# Patient Record
Sex: Female | Born: 1990 | Hispanic: Yes | Marital: Single | Smoking: Never smoker
Health system: Southern US, Community
[De-identification: ages and names within clinical notes are randomized; demographics above are authoritative.]

## PROBLEM LIST (undated history)

## (undated) ENCOUNTER — Inpatient Hospital Stay (HOSPITAL_COMMUNITY): Payer: Self-pay

## (undated) DIAGNOSIS — K8020 Calculus of gallbladder without cholecystitis without obstruction: Secondary | ICD-10-CM

## (undated) DIAGNOSIS — Z8489 Family history of other specified conditions: Secondary | ICD-10-CM

## (undated) DIAGNOSIS — D649 Anemia, unspecified: Secondary | ICD-10-CM

## (undated) DIAGNOSIS — I82409 Acute embolism and thrombosis of unspecified deep veins of unspecified lower extremity: Secondary | ICD-10-CM

## (undated) HISTORY — PX: DILATION AND CURETTAGE OF UTERUS: SHX78

---
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
## 2008-01-14 ENCOUNTER — Encounter: Payer: Self-pay | Admitting: Family Medicine

## 2008-01-14 ENCOUNTER — Ambulatory Visit: Payer: Self-pay | Admitting: Physician Assistant

## 2008-01-14 ENCOUNTER — Inpatient Hospital Stay (HOSPITAL_COMMUNITY): Admission: AD | Admit: 2008-01-14 | Discharge: 2008-01-16 | Payer: Self-pay | Admitting: Obstetrics & Gynecology

## 2008-01-21 ENCOUNTER — Ambulatory Visit: Payer: Self-pay | Admitting: Family Medicine

## 2008-01-21 DIAGNOSIS — I82890 Acute embolism and thrombosis of other specified veins: Secondary | ICD-10-CM

## 2008-01-21 LAB — CONVERTED CEMR LAB: INR: 1.4

## 2008-01-25 ENCOUNTER — Ambulatory Visit: Payer: Self-pay | Admitting: Family Medicine

## 2008-01-25 LAB — CONVERTED CEMR LAB: INR: 1.9

## 2008-02-04 ENCOUNTER — Ambulatory Visit: Payer: Self-pay | Admitting: Family Medicine

## 2008-02-11 ENCOUNTER — Ambulatory Visit: Payer: Self-pay | Admitting: Family Medicine

## 2008-02-11 LAB — CONVERTED CEMR LAB: INR: 1.9

## 2009-04-12 ENCOUNTER — Inpatient Hospital Stay (HOSPITAL_COMMUNITY): Admission: AD | Admit: 2009-04-12 | Discharge: 2009-04-12 | Payer: Self-pay | Admitting: Obstetrics and Gynecology

## 2009-04-14 ENCOUNTER — Inpatient Hospital Stay (HOSPITAL_COMMUNITY): Admission: AD | Admit: 2009-04-14 | Discharge: 2009-04-14 | Payer: Self-pay | Admitting: Obstetrics and Gynecology

## 2009-04-27 ENCOUNTER — Ambulatory Visit (HOSPITAL_COMMUNITY): Admission: RE | Admit: 2009-04-27 | Discharge: 2009-04-27 | Payer: Self-pay | Admitting: Obstetrics and Gynecology

## 2009-04-27 ENCOUNTER — Ambulatory Visit: Payer: Self-pay | Admitting: Obstetrics and Gynecology

## 2009-09-10 ENCOUNTER — Emergency Department (HOSPITAL_COMMUNITY): Admission: EM | Admit: 2009-09-10 | Discharge: 2009-09-10 | Payer: Self-pay | Admitting: Emergency Medicine

## 2009-09-16 ENCOUNTER — Inpatient Hospital Stay (HOSPITAL_COMMUNITY): Admission: AD | Admit: 2009-09-16 | Discharge: 2009-09-16 | Payer: Self-pay | Admitting: Obstetrics & Gynecology

## 2009-10-25 ENCOUNTER — Inpatient Hospital Stay (HOSPITAL_COMMUNITY): Admission: AD | Admit: 2009-10-25 | Discharge: 2009-10-25 | Payer: Self-pay | Admitting: Obstetrics & Gynecology

## 2009-10-25 ENCOUNTER — Ambulatory Visit: Payer: Self-pay | Admitting: Nurse Practitioner

## 2009-10-28 ENCOUNTER — Ambulatory Visit: Payer: Self-pay | Admitting: Obstetrics and Gynecology

## 2009-10-28 ENCOUNTER — Ambulatory Visit (HOSPITAL_COMMUNITY): Admission: RE | Admit: 2009-10-28 | Discharge: 2009-10-28 | Payer: Self-pay | Admitting: Obstetrics and Gynecology

## 2009-11-07 ENCOUNTER — Ambulatory Visit (HOSPITAL_COMMUNITY): Admission: RE | Admit: 2009-11-07 | Discharge: 2009-11-07 | Payer: Self-pay | Admitting: Obstetrics and Gynecology

## 2009-11-07 ENCOUNTER — Ambulatory Visit: Payer: Self-pay | Admitting: Nurse Practitioner

## 2010-03-16 ENCOUNTER — Ambulatory Visit: Payer: Self-pay | Admitting: Advanced Practice Midwife

## 2010-03-16 ENCOUNTER — Inpatient Hospital Stay (HOSPITAL_COMMUNITY): Admission: AD | Admit: 2010-03-16 | Discharge: 2010-03-16 | Payer: Self-pay | Admitting: Obstetrics & Gynecology

## 2010-04-15 ENCOUNTER — Inpatient Hospital Stay (HOSPITAL_COMMUNITY)
Admission: AD | Admit: 2010-04-15 | Discharge: 2010-04-16 | Payer: Self-pay | Source: Home / Self Care | Admitting: Obstetrics

## 2010-06-07 ENCOUNTER — Inpatient Hospital Stay (HOSPITAL_COMMUNITY)
Admission: AD | Admit: 2010-06-07 | Discharge: 2010-06-07 | Payer: Self-pay | Source: Home / Self Care | Attending: Obstetrics | Admitting: Obstetrics

## 2010-06-09 ENCOUNTER — Encounter: Payer: Self-pay | Admitting: Obstetrics and Gynecology

## 2010-06-13 ENCOUNTER — Inpatient Hospital Stay (HOSPITAL_COMMUNITY)
Admission: AD | Admit: 2010-06-13 | Discharge: 2010-06-13 | Payer: Self-pay | Source: Home / Self Care | Attending: Obstetrics | Admitting: Obstetrics

## 2010-06-15 ENCOUNTER — Inpatient Hospital Stay (HOSPITAL_COMMUNITY)
Admission: AD | Admit: 2010-06-15 | Discharge: 2010-06-15 | Payer: Self-pay | Source: Home / Self Care | Attending: Obstetrics | Admitting: Obstetrics

## 2010-06-15 LAB — CBC
MCH: 21.2 pg — ABNORMAL LOW (ref 26.0–34.0)
RBC: 4.2 MIL/uL (ref 3.87–5.11)

## 2010-06-17 ENCOUNTER — Inpatient Hospital Stay (HOSPITAL_COMMUNITY)
Admission: AD | Admit: 2010-06-17 | Discharge: 2010-06-19 | DRG: 775 | Disposition: A | Discharge status: Home / Self Care | Payer: Medicaid Other | Attending: Obstetrics | Admitting: Obstetrics

## 2010-06-17 LAB — CBC
Hemoglobin: 8.7 g/dL — ABNORMAL LOW (ref 12.0–15.0)
MCV: 69.2 fL — ABNORMAL LOW (ref 78.0–100.0)
Platelets: 194 10*3/uL (ref 150–400)
RDW: 17.8 % — ABNORMAL HIGH (ref 11.5–15.5)

## 2010-06-17 LAB — RAPID HIV SCREEN (WH-MAU): Rapid HIV Screen: NONREACTIVE

## 2010-06-18 LAB — CBC
Hemoglobin: 7 g/dL — ABNORMAL LOW (ref 12.0–15.0)
MCH: 20.8 pg — ABNORMAL LOW (ref 26.0–34.0)
MCHC: 30 g/dL (ref 30.0–36.0)
Platelets: 149 10*3/uL — ABNORMAL LOW (ref 150–400)
RBC: 3.32 MIL/uL — ABNORMAL LOW (ref 3.87–5.11)
RDW: 17.9 % — ABNORMAL HIGH (ref 11.5–15.5)
WBC: 8 10*3/uL (ref 4.0–10.5)

## 2010-07-30 LAB — URINALYSIS, ROUTINE W REFLEX MICROSCOPIC
Bilirubin Urine: NEGATIVE
Glucose, UA: NEGATIVE mg/dL
Nitrite: POSITIVE — AB
Protein, ur: NEGATIVE mg/dL
Specific Gravity, Urine: >1.03 — ABNORMAL HIGH (ref 1.005–1.030)
Urobilinogen, UA: 1 mg/dL (ref 0.0–1.0)
pH: 6 (ref 5.0–8.0)

## 2010-07-30 LAB — URINE MICROSCOPIC-ADD ON

## 2010-07-31 LAB — URINE CULTURE
Colony Count: >=100000
Culture  Setup Time: 201110300231

## 2010-07-31 LAB — URINALYSIS, ROUTINE W REFLEX MICROSCOPIC
Glucose, UA: NEGATIVE mg/dL
Ketones, ur: NEGATIVE mg/dL
Leukocytes, UA: NEGATIVE
Protein, ur: NEGATIVE mg/dL
Urobilinogen, UA: 0.2 mg/dL (ref 0.0–1.0)
pH: 6 (ref 5.0–8.0)

## 2010-08-05 LAB — WET PREP, GENITAL
Trich, Wet Prep: NONE SEEN
Yeast Wet Prep HPF POC: NONE SEEN

## 2010-08-05 LAB — URINALYSIS, ROUTINE W REFLEX MICROSCOPIC
Bilirubin Urine: NEGATIVE
Leukocytes, UA: NEGATIVE
Nitrite: NEGATIVE
Specific Gravity, Urine: 1.025 (ref 1.005–1.030)
Urobilinogen, UA: 0.2 mg/dL (ref 0.0–1.0)

## 2010-08-05 LAB — CBC
MCHC: 33.8 g/dL (ref 30.0–36.0)
RBC: 4.06 MIL/uL (ref 3.87–5.11)
RDW: 17.3 % — ABNORMAL HIGH (ref 11.5–15.5)

## 2010-08-05 LAB — HCG, QUANTITATIVE, PREGNANCY: hCG, Beta Chain, Quant, S: 1301 m[IU]/mL — ABNORMAL HIGH (ref <5)

## 2010-08-05 LAB — URINE MICROSCOPIC-ADD ON

## 2010-08-05 LAB — GC/CHLAMYDIA PROBE AMP, GENITAL
Chlamydia, DNA Probe: NEGATIVE
GC Probe Amp, Genital: NEGATIVE

## 2010-08-05 LAB — POCT PREGNANCY, URINE: Preg Test, Ur: POSITIVE

## 2010-08-05 LAB — ABO/RH: ABO/RH(D): A POS

## 2010-08-06 LAB — URINALYSIS, ROUTINE W REFLEX MICROSCOPIC
Nitrite: POSITIVE — AB
Protein, ur: 100 mg/dL — AB
Specific Gravity, Urine: 1.02 (ref 1.005–1.030)
Urobilinogen, UA: 1 mg/dL (ref 0.0–1.0)

## 2010-08-06 LAB — URINE MICROSCOPIC-ADD ON

## 2010-08-06 LAB — URINE CULTURE

## 2010-08-06 LAB — GC/CHLAMYDIA PROBE AMP, GENITAL
Chlamydia, DNA Probe: NEGATIVE
GC Probe Amp, Genital: NEGATIVE

## 2010-08-06 LAB — WET PREP, GENITAL: Yeast Wet Prep HPF POC: NONE SEEN

## 2010-08-06 LAB — POCT PREGNANCY, URINE: Preg Test, Ur: NEGATIVE

## 2010-08-21 LAB — URINALYSIS, ROUTINE W REFLEX MICROSCOPIC
Glucose, UA: NEGATIVE mg/dL
Ketones, ur: 15 mg/dL — AB
Leukocytes, UA: NEGATIVE
Protein, ur: NEGATIVE mg/dL
Urobilinogen, UA: 0.2 mg/dL (ref 0.0–1.0)

## 2010-08-21 LAB — CBC
Hemoglobin: 11.2 g/dL — ABNORMAL LOW (ref 12.0–15.0)
RBC: 4.27 MIL/uL (ref 3.87–5.11)
WBC: 9.5 10*3/uL (ref 4.0–10.5)

## 2010-08-21 LAB — HCG, QUANTITATIVE, PREGNANCY: hCG, Beta Chain, Quant, S: 1807 m[IU]/mL — ABNORMAL HIGH (ref <5)

## 2010-08-21 LAB — ABO/RH: ABO/RH(D): A POS

## 2010-08-21 LAB — WET PREP, GENITAL
Trich, Wet Prep: NONE SEEN
Yeast Wet Prep HPF POC: NONE SEEN

## 2010-08-21 LAB — URINE MICROSCOPIC-ADD ON

## 2010-08-21 LAB — GC/CHLAMYDIA PROBE AMP, GENITAL: Chlamydia, DNA Probe: NEGATIVE

## 2010-09-13 ENCOUNTER — Encounter: Payer: Self-pay | Admitting: Obstetrics and Gynecology

## 2010-10-01 NOTE — Discharge Summary (Signed)
Lindsey Paul, SCHUUR              ACCOUNT NO.:  [id number]   MEDICAL RECORD NO.:  [id number]          PATIENT TYPE:  INP   LOCATION:  9127                          FACILITY:  [facility]   PHYSICIAN:  Allie Bossier, MD        DATE OF BIRTH:  [birth year]   DATE OF ADMISSION:  01/14/2008  DATE OF DISCHARGE:  01/16/2008                               DISCHARGE SUMMARY   ADMITTING DIAGNOSIS:  Intrauterine pregnancy at term.   DISCHARGE DIAGNOSES:  Intrauterine pregnancy delivered at term.   HOSPITAL COURSE:  [name] is an 20 year old G3 now P-3-0-0-3 at 40  and [redacted] weeks gestational age who delivered a viable female by a normal  spontaneous vaginal delivery.  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  Upon delivery, she was started on Coumadin 5  mg per day with no bridge with Lovenox as she refused Lovenox injection.  On day of discharge, her INR was 1.0 and she was instructed to followup  at the [facility] on Tuesday.  She is to  follow with an anticoagulation regimen for 6 weeks postpartum.   DISCHARGE MEDICATIONS:  1. Ibuprofen 600 mg every 6 hours as needed for pain.  2. Coumadin 5 mg to take 1 p.o. daily until reevaluation of the      Coumadin Clinic.   DISCHARGE INSTRUCTIONS:  Normal and postpartum precautions.  Followup at  the [facility] Clinic on Tuesday, January 19, 2008.  Followup for normal  and postpartum check in 4-6 weeks.      Helane Rima, MD  Electronically Signed     ______________________________  Allie Bossier, MD    EW/MEDQ  D:  01/17/2008  T:  01/17/2008  Job:  [id number]

## 2011-02-06 LAB — GC/CHLAMYDIA PROBE AMP, GENITAL: GC Probe Amp, Genital: NEGATIVE

## 2011-02-06 LAB — URINE MICROSCOPIC-ADD ON

## 2011-02-06 LAB — URINALYSIS, ROUTINE W REFLEX MICROSCOPIC
Ketones, ur: NEGATIVE
Protein, ur: NEGATIVE
Urobilinogen, UA: 0.2

## 2011-02-06 LAB — CBC
HCT: 36.3
Hemoglobin: 12.3
MCHC: 34
MCV: 81.1
RDW: 16.3 — ABNORMAL HIGH

## 2011-02-06 LAB — WET PREP, GENITAL
Trich, Wet Prep: NONE SEEN
Yeast Wet Prep HPF POC: NONE SEEN

## 2011-02-13 LAB — URINALYSIS, ROUTINE W REFLEX MICROSCOPIC
Bilirubin Urine: NEGATIVE
Glucose, UA: NEGATIVE
Ketones, ur: NEGATIVE
Protein, ur: NEGATIVE
Urobilinogen, UA: 0.2

## 2011-02-13 LAB — URINE MICROSCOPIC-ADD ON

## 2011-02-13 LAB — URINE CULTURE: Colony Count: >=100000

## 2011-02-14 LAB — URINALYSIS, ROUTINE W REFLEX MICROSCOPIC
Glucose, UA: NEGATIVE
Hgb urine dipstick: NEGATIVE
Ketones, ur: 15 — AB
Protein, ur: NEGATIVE
pH: 6

## 2011-09-20 ENCOUNTER — Encounter (HOSPITAL_COMMUNITY): Payer: Self-pay | Admitting: Emergency Medicine

## 2011-09-20 ENCOUNTER — Emergency Department (HOSPITAL_COMMUNITY)
Admission: EM | Admit: 2011-09-20 | Discharge: 2011-09-20 | Disposition: A | Discharge status: Home / Self Care | Payer: Self-pay | Attending: Emergency Medicine | Admitting: Emergency Medicine

## 2011-09-20 DIAGNOSIS — L0291 Cutaneous abscess, unspecified: Secondary | ICD-10-CM | POA: Insufficient documentation

## 2011-09-20 DIAGNOSIS — L0390 Cellulitis, unspecified: Secondary | ICD-10-CM

## 2011-09-20 MED ORDER — CEPHALEXIN 500 MG PO CAPS: 500.0000 mg | ORAL_CAPSULE | Freq: Four times a day (QID) | ORAL | Status: AC

## 2011-09-20 MED ORDER — SULFAMETHOXAZOLE-TRIMETHOPRIM 800-160 MG PO TABS: 1.0000 | ORAL_TABLET | Freq: Two times a day (BID) | ORAL | Status: AC

## 2011-09-20 MED ORDER — IBUPROFEN 800 MG PO TABS
800.0000 mg | ORAL_TABLET | Freq: Once | ORAL | Status: AC
  Administered 2011-09-20: 800 mg via ORAL
  Filled 2011-09-20: qty 1

## 2011-09-20 NOTE — ED Provider Notes (Signed)
History   This chart was scribed for Nat Christen, MD by Melba Coon. The patient was seen in room STRE1/STRE1 and the patient's care was started at 3:01PM.    CSN: [id number]  Arrival date & time 09/20/11  1402   First MD Initiated Contact with Patient 09/20/11 1456      Chief Complaint  Patient presents with  . Abscess    (Consider location/radiation/quality/duration/timing/severity/associated sxs/prior treatment) HPI Lindsey Paul is a 21 y.o. female who presents to the Emergency Department complaining of persistent, moderate to severe tender abd RLQ abscess with an onset about a week ago. Pt has never had these types of symptoms before. Pt states that it "started it as pimple but now it is the size of a grape." No HA, fever, neck pain, sore throat, rash, back pain, CP, SOB, abd pain, n/v/d, dysuria, or extremity pain, edema, weakness, numbness, or tingling. No known allergies. No other pertinent medical symptoms.  History reviewed. No pertinent past medical history.  No past surgical history on file.  No family history on file.  History  Substance Use Topics  . Smoking status: Not on file  . Smokeless tobacco: Not on file  . Alcohol Use: Not on file    OB History    Grav Para Term Preterm Abortions TAB SAB Ect Mult Living                  Review of Systems 10 Systems reviewed and all are negative for acute change except as noted in the HPI.   Allergies  Review of patient's allergies indicates no known allergies.  Home Medications   Current Outpatient Rx  Name Route Sig Dispense Refill  . CEPHALEXIN 500 MG PO CAPS Oral Take 1 capsule (500 mg total) by mouth 4 (four) times daily. 40 capsule 0  . SULFAMETHOXAZOLE-TRIMETHOPRIM 800-160 MG PO TABS Oral Take 1 tablet by mouth 2 (two) times daily. 20 tablet 0    BP 120/68  Pulse 67  Temp(Src) 98.2 F (36.8 C) (Oral)  Resp 20  SpO2 99%  LMP 08/18/2011  Physical Exam  Nursing note and vitals  reviewed. Constitutional: She is oriented to person, place, and time. She appears well-developed and well-nourished. No distress.  HENT:  Head: Normocephalic and atraumatic.  Eyes: EOM are normal. Pupils are equal, round, and reactive to light.  Neck: Normal range of motion. Neck supple. No tracheal deviation present.  Cardiovascular: Normal rate.   No murmur heard. Pulmonary/Chest: Effort normal. No respiratory distress.  Abdominal: Soft. There is no tenderness.  Musculoskeletal: Normal range of motion. She exhibits no edema and no tenderness.  Neurological: She is alert and oriented to person, place, and time.  Skin: Skin is warm and dry.       1 cm pimple-like area within a 6 cm area of erythema with tenderness but no fluctuance; no induration  Psychiatric: She has a normal mood and affect. Her behavior is normal.    ED Course  Procedures (including critical care time)  DIAGNOSTIC STUDIES: Oxygen Saturation is 99% on rooma ir, normal by my interpretation.    COORDINATION OF CARE:  3:05PM - EDMD will prescribe abx for the pt. Pt ready for d/c   Labs Reviewed - No data to display No results found.   1. Cellulitis       MDM  Patient with mild area of cellulitis with no focal abscess to drain at this time.  Patient was given instructions to return for  worsening redness or pain.  She'll be placed on Bactrim and Keflex to cover for both MRSA and common strep bacteria that might cause this.     I personally performed the services described in this documentation, which was scribed in my presence. The recorded information has been reviewed and considered.     Nat Christen, MD 09/20/11 1539

## 2011-09-20 NOTE — ED Notes (Signed)
To ED for eval of possible abscess on rlq. Pt states this has been here for about a week. No discharge. Started as a pimple and now about large grape size.

## 2011-09-20 NOTE — Discharge Instructions (Signed)

## 2012-01-04 IMAGING — US US OB TRANSVAGINAL
1 series · 14 of 26 positions shown · non-contrast
Comparison: none

OBSTETRICAL ULTRASOUND:
 This ultrasound exam was performed in the [HOSPITAL] Ultrasound Department.  The OB US report was generated in the AS system, and faxed to the ordering physician.  This report is also available in [HOSPITAL]?s [employer] and in [REDACTED] PACS.

[Series 1: us ob transvaginal · 0.17mm/px · 26 acquisitions, 14 frames shown]
[im 1/26]
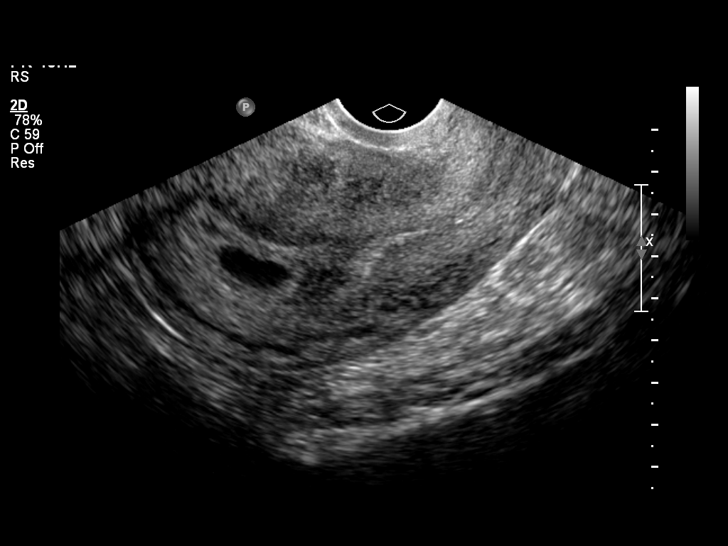
[im 3/26]
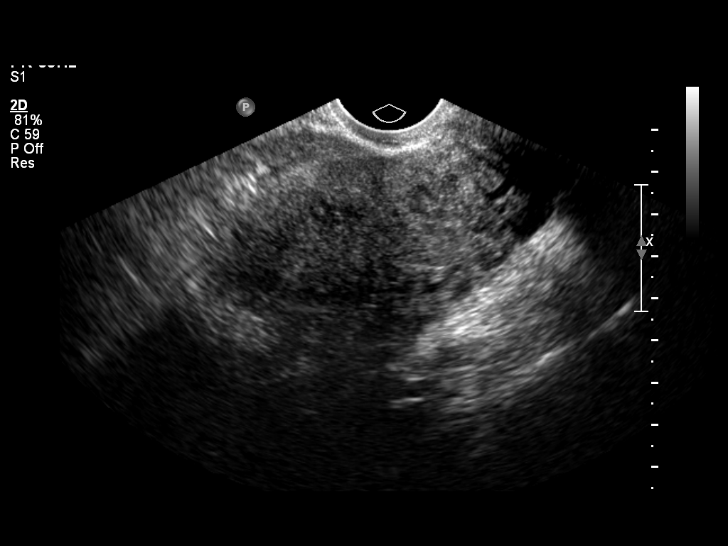
[im 5/26]
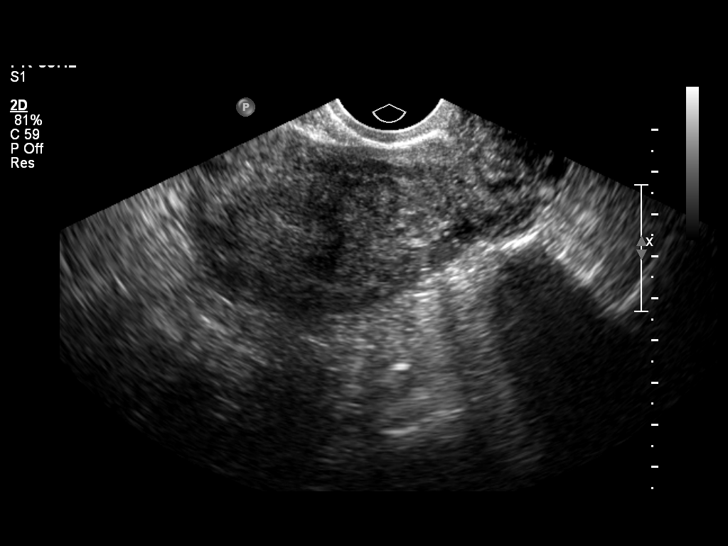
[im 7/26]
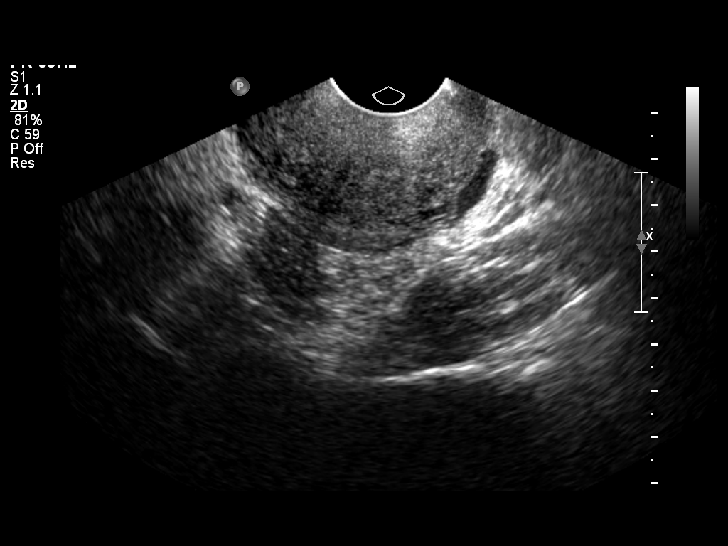
[im 9/26]
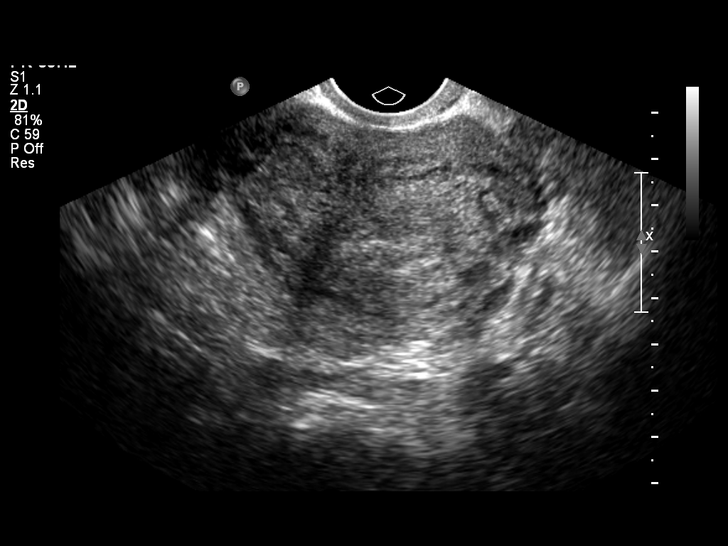
[im 11/26]
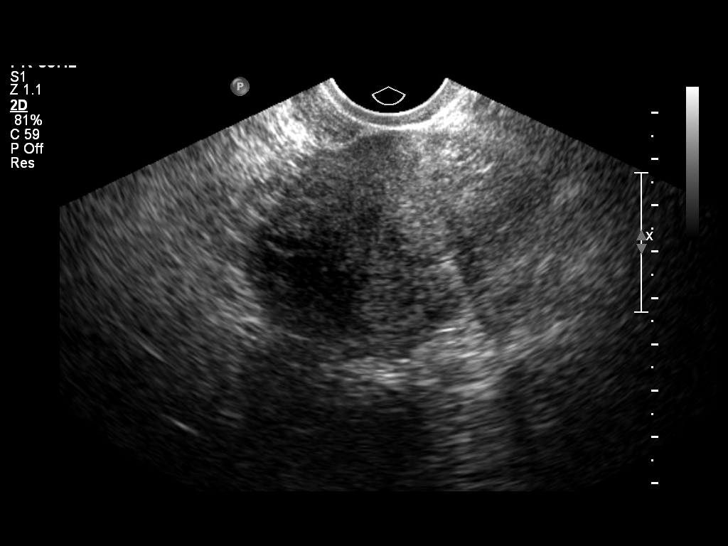
[im 13/26]
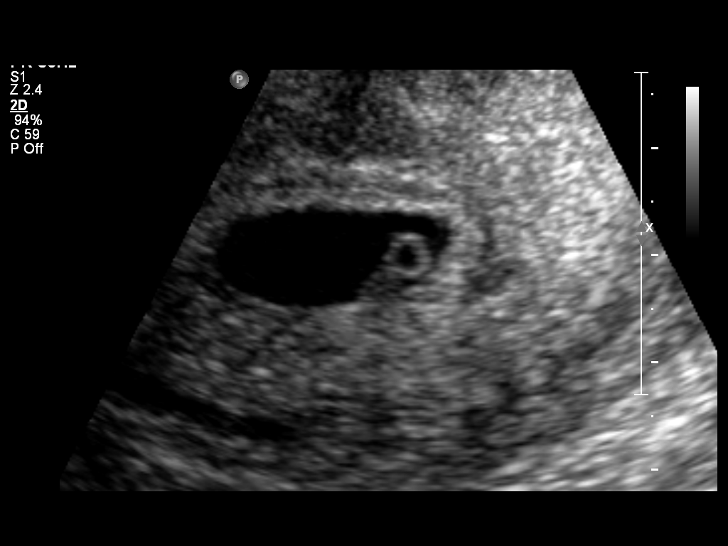
[im 14/26]
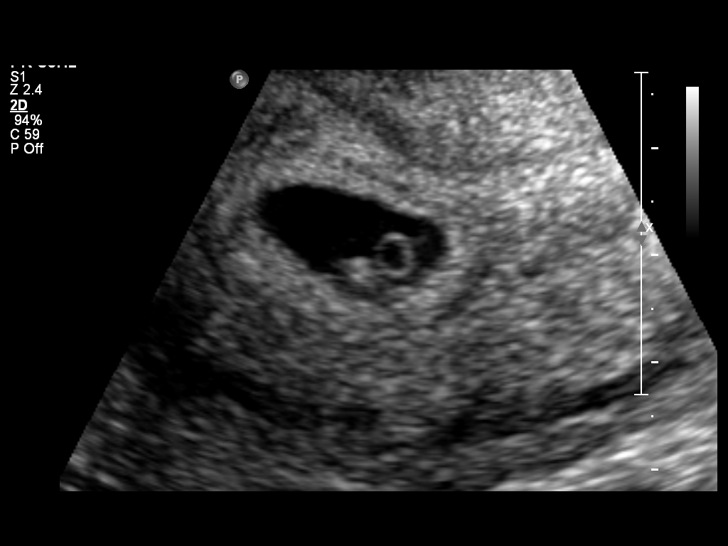
[im 16/26]
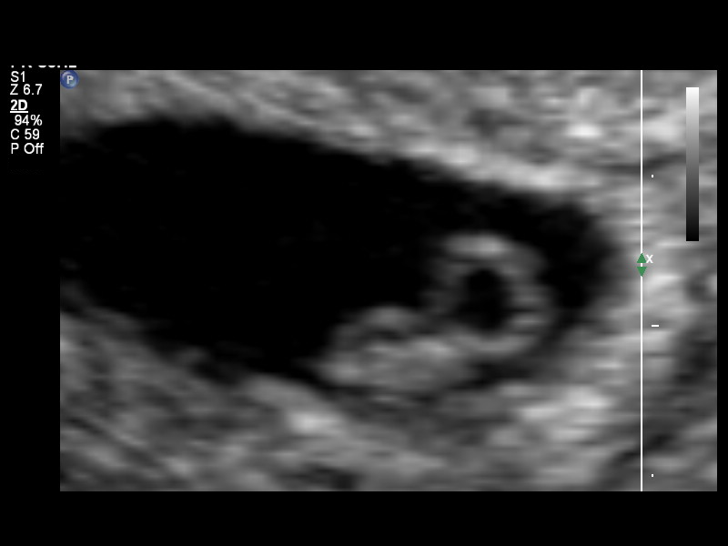
[im 18/26]
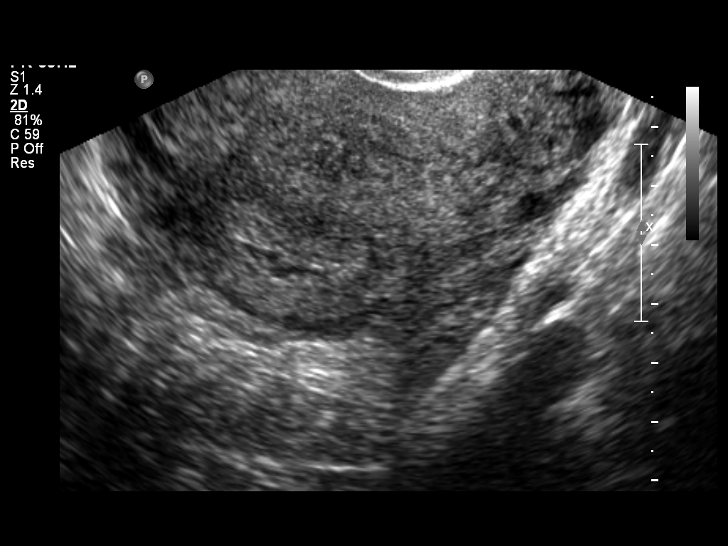
[im 20/26]
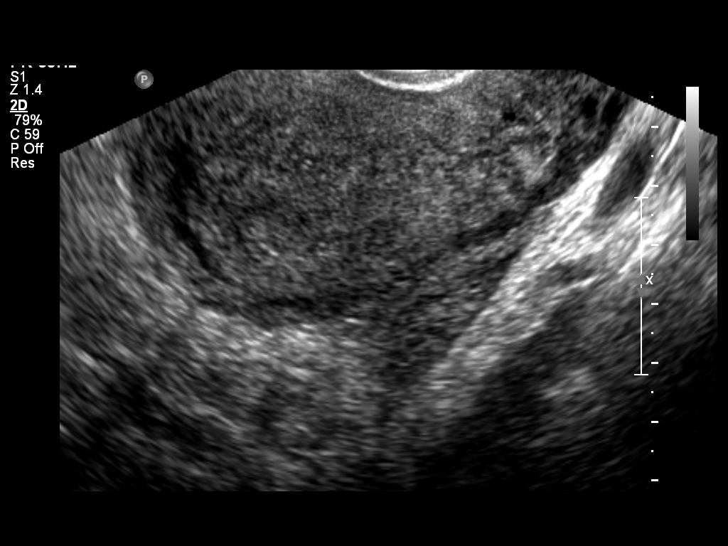
[im 22/26]
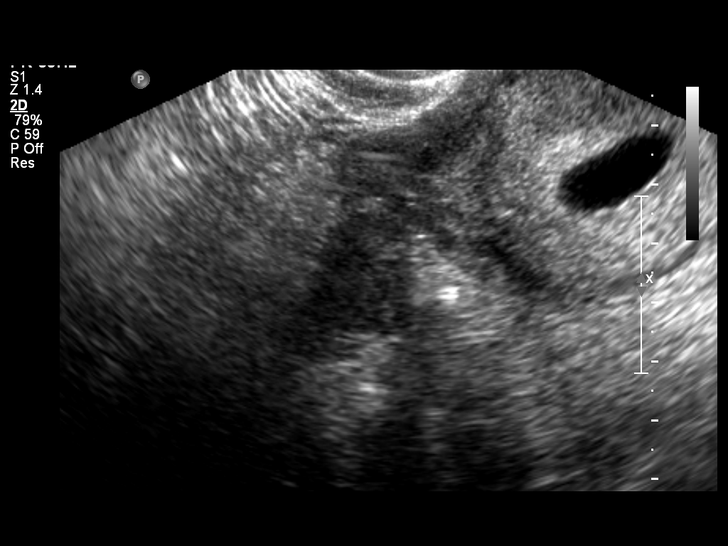
[im 24/26]
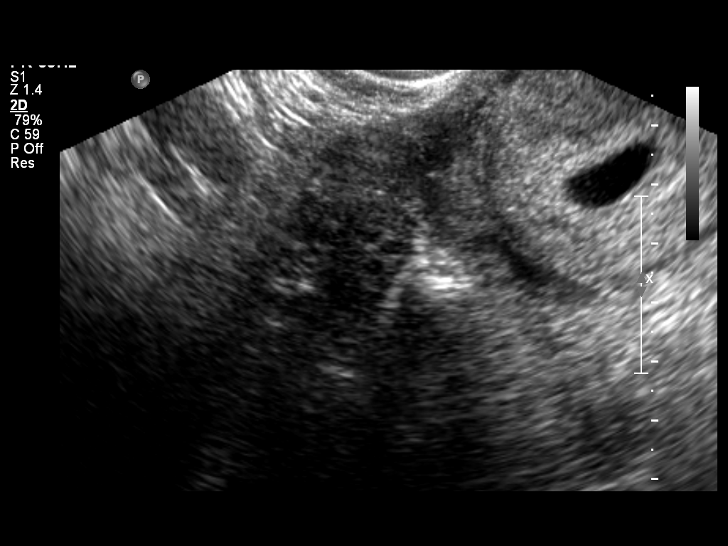
[im 26/26]
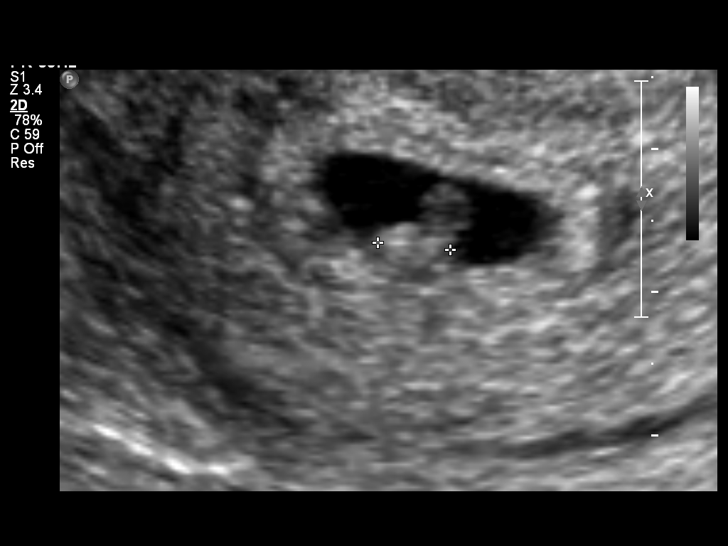

[14 of 26 positions shown; findings below may reference images not displayed]

IMPRESSION: See AS Obstetric US report.

## 2012-03-14 ENCOUNTER — Encounter (HOSPITAL_COMMUNITY): Payer: Self-pay | Admitting: *Deleted

## 2012-03-14 ENCOUNTER — Inpatient Hospital Stay (HOSPITAL_COMMUNITY)
Admission: AD | Admit: 2012-03-14 | Discharge: 2012-03-14 | Disposition: A | Discharge status: Home / Self Care | Payer: Self-pay | Source: Ambulatory Visit | Attending: Obstetrics & Gynecology | Admitting: Obstetrics & Gynecology

## 2012-03-14 DIAGNOSIS — N390 Urinary tract infection, site not specified: Secondary | ICD-10-CM | POA: Insufficient documentation

## 2012-03-14 DIAGNOSIS — Z3201 Encounter for pregnancy test, result positive: Secondary | ICD-10-CM

## 2012-03-14 DIAGNOSIS — A499 Bacterial infection, unspecified: Secondary | ICD-10-CM | POA: Insufficient documentation

## 2012-03-14 DIAGNOSIS — B9689 Other specified bacterial agents as the cause of diseases classified elsewhere: Secondary | ICD-10-CM | POA: Insufficient documentation

## 2012-03-14 DIAGNOSIS — O2390 Unspecified genitourinary tract infection in pregnancy, unspecified trimester: Secondary | ICD-10-CM | POA: Insufficient documentation

## 2012-03-14 DIAGNOSIS — B3731 Acute candidiasis of vulva and vagina: Secondary | ICD-10-CM | POA: Insufficient documentation

## 2012-03-14 DIAGNOSIS — B373 Candidiasis of vulva and vagina: Secondary | ICD-10-CM | POA: Insufficient documentation

## 2012-03-14 DIAGNOSIS — N760 Acute vaginitis: Secondary | ICD-10-CM | POA: Insufficient documentation

## 2012-03-14 DIAGNOSIS — O2340 Unspecified infection of urinary tract in pregnancy, unspecified trimester: Secondary | ICD-10-CM

## 2012-03-14 DIAGNOSIS — R109 Unspecified abdominal pain: Secondary | ICD-10-CM | POA: Insufficient documentation

## 2012-03-14 LAB — URINALYSIS, ROUTINE W REFLEX MICROSCOPIC
Bilirubin Urine: NEGATIVE
Nitrite: NEGATIVE
Specific Gravity, Urine: 1.015 (ref 1.005–1.030)
Urobilinogen, UA: 0.2 mg/dL (ref 0.0–1.0)
pH: 6.5 (ref 5.0–8.0)

## 2012-03-14 LAB — URINE MICROSCOPIC-ADD ON

## 2012-03-14 LAB — WET PREP, GENITAL

## 2012-03-14 MED ORDER — FLUCONAZOLE 150 MG PO TABS: 150.0000 mg | ORAL_TABLET | Freq: Once | ORAL | Status: DC

## 2012-03-14 MED ORDER — METRONIDAZOLE 500 MG PO TABS: 500.0000 mg | ORAL_TABLET | Freq: Two times a day (BID) | ORAL | Status: DC

## 2012-03-14 MED ORDER — CEPHALEXIN 500 MG PO CAPS: 500.0000 mg | ORAL_CAPSULE | Freq: Two times a day (BID) | ORAL | Status: DC

## 2012-03-14 NOTE — MAU Provider Note (Signed)
Attestation of Attending Supervision of Advanced Practitioner (CNM/NP): Evaluation and management procedures were performed by the Advanced Practitioner under my supervision and collaboration.  I have reviewed the Advanced Practitioner's note and chart, and I agree with the management and plan.  UGONNA  ANYANWU, MD, FACOG Attending Obstetrician & Gynecologist Faculty Practice, [facility]  

## 2012-03-14 NOTE — MAU Provider Note (Signed)
History     CSN: [id number]  Arrival date and time: 03/14/12 1028   None     Chief Complaint  Patient presents with  . Abdominal Pain   HPI 21 y.o. [id number] at [redacted]w[redacted]d with low abd and back pain x "a few weeks", no bleeding or discharge.    History reviewed. No pertinent past medical history.  History reviewed. No pertinent past surgical history.  History reviewed. No pertinent family history.  History  Substance Use Topics  . Smoking status: Never Smoker   . Smokeless tobacco: Not on file  . Alcohol Use: No    Allergies: No Known Allergies  No prescriptions prior to admission    Review of Systems  Constitutional: Negative.   Respiratory: Negative.   Cardiovascular: Negative.   Gastrointestinal: Positive for abdominal pain. Negative for nausea, vomiting, diarrhea and constipation.  Genitourinary: Negative for dysuria, urgency, frequency, hematuria and flank pain.       Negative for vaginal bleeding, cramping/contractions  Musculoskeletal: Positive for back pain.  Neurological: Negative.   Psychiatric/Behavioral: Negative.    Physical Exam   Blood pressure 121/65, pulse 84, temperature 98.2 F (36.8 C), temperature source Oral, resp. rate 16, height 5\' 4"  (1.626 m), weight 215 lb 12.8 oz (97.886 kg), last menstrual period 01/04/2012, unknown if currently breastfeeding.  Physical Exam  Constitutional: She is oriented to person, place, and time. She appears well-developed and well-nourished. No distress.  HENT:  Head: Normocephalic and atraumatic.  Cardiovascular: Normal rate.   Respiratory: Effort normal. No respiratory distress.  GI: Soft. She exhibits no distension and no mass. There is no tenderness. There is no rebound and no guarding.  Genitourinary: There is no rash or lesion on the right labia. There is no rash or lesion on the left labia. Uterus is not deviated, not enlarged, not fixed and not tender. Cervix exhibits no motion tenderness, no discharge  and no friability. Right adnexum displays no mass, no tenderness and no fullness. Left adnexum displays no mass, no tenderness and no fullness. No erythema, tenderness or bleeding around the vagina. Vaginal discharge (thin, white) found.  Neurological: She is alert and oriented to person, place, and time.  Skin: Skin is warm and dry.  Psychiatric: She has a normal mood and affect.   + FHR, 150s, by bedside u/s MAU Course  Procedures  Results for orders placed during the hospital encounter of 03/14/12 (from the past 24 hour(s))  URINALYSIS, ROUTINE W REFLEX MICROSCOPIC     Status: Abnormal   Collection Time   03/14/12 10:52 AM      Component Value Range   Color, Urine YELLOW  YELLOW   APPearance HAZY (*) CLEAR   Specific Gravity, Urine 1.015  1.005 - 1.030   pH 6.5  5.0 - 8.0   Glucose, UA NEGATIVE  NEGATIVE mg/dL   Hgb urine dipstick MODERATE (*) NEGATIVE   Bilirubin Urine NEGATIVE  NEGATIVE   Ketones, ur NEGATIVE  NEGATIVE mg/dL   Protein, ur NEGATIVE  NEGATIVE mg/dL   Urobilinogen, UA 0.2  0.0 - 1.0 mg/dL   Nitrite NEGATIVE  NEGATIVE   Leukocytes, UA SMALL (*) NEGATIVE  URINE MICROSCOPIC-ADD ON     Status: Abnormal   Collection Time   03/14/12 10:52 AM      Component Value Range   Squamous Epithelial / LPF FEW (*) RARE   WBC, UA 11-20  <3 WBC/hpf   RBC / HPF 3-6  <3 RBC/hpf   Bacteria, UA FEW (*) RARE  POCT PREGNANCY, URINE     Status: Abnormal   Collection Time   03/14/12 11:12 AM      Component Value Range   Preg Test, Ur POSITIVE (*) NEGATIVE  WET PREP, GENITAL     Status: Abnormal   Collection Time   03/14/12 11:35 AM      Component Value Range   Yeast Wet Prep HPF POC FEW (*) NONE SEEN   Trich, Wet Prep NONE SEEN  NONE SEEN   Clue Cells Wet Prep HPF POC FEW (*) NONE SEEN   WBC, Wet Prep HPF POC FEW (*) NONE SEEN     Assessment and Plan   1. BV (bacterial vaginosis)   2. Vaginal candida   3. Positive pregnancy test   4. UTI in pregnancy         Medication List     As of 03/14/2012  7:41 PM    START taking these medications         cephALEXin 500 MG capsule   Commonly known as: KEFLEX   Take 1 capsule (500 mg total) by mouth 2 (two) times daily.      fluconazole 150 MG tablet   Commonly known as: DIFLUCAN   Take 1 tablet (150 mg total) by mouth once.      metroNIDAZOLE 500 MG tablet   Commonly known as: FLAGYL   Take 1 tablet (500 mg total) by mouth 2 (two) times daily.          Where to get your medications    These are the prescriptions that you need to pick up. We sent them to a specific pharmacy, so you will need to go there to get them.   [id number] [place], [place] - [phone] AT [facility] [address]    [address] [place] [address]    Phone: [phone]        cephALEXin 500 MG capsule   fluconazole 150 MG tablet   metroNIDAZOLE 500 MG tablet            Follow-up Information    Follow up with [facility] [place]. (start prenatal care as soon as possible)    Contact information:   [address] [place] [address] [phone]           Lindee Leason 03/14/2012, 7:41 PM

## 2012-03-14 NOTE — MAU Note (Addendum)
Pt reports having lower abd cramping and back/flank pain  On and off for a week. Pt took 2 HPT and both were positive.

## 2012-03-15 LAB — GC/CHLAMYDIA PROBE AMP, GENITAL
Chlamydia, DNA Probe: NEGATIVE
GC Probe Amp, Genital: NEGATIVE

## 2012-03-16 LAB — URINE CULTURE: Colony Count: >=100000

## 2012-03-17 ENCOUNTER — Telehealth: Payer: Self-pay | Admitting: Advanced Practice Midwife

## 2012-03-17 ENCOUNTER — Other Ambulatory Visit: Payer: Self-pay | Admitting: Obstetrics & Gynecology

## 2012-03-17 ENCOUNTER — Telehealth: Payer: Self-pay | Admitting: Nurse Practitioner

## 2012-03-17 DIAGNOSIS — O2341 Unspecified infection of urinary tract in pregnancy, first trimester: Secondary | ICD-10-CM

## 2012-03-17 MED ORDER — NITROFURANTOIN MONOHYD MACRO 100 MG PO CAPS: 100.0000 mg | ORAL_CAPSULE | Freq: Two times a day (BID) | ORAL | Status: DC

## 2012-03-17 NOTE — Progress Notes (Addendum)
Called pt to give her information regarding new Rx from Dr. Macon Large.  Pt mobile # is not valid and the home # does not have voice mail and could not leave message.  11/5  1130  Attempted to contact pt again and was unsuccessful with all numbers listed, including additional contact #. I then called the pharmacy and spoke w/Autumn to see if pt had picked up any of the medication prescribed. She stated that pt did go to pharmacy but was unsure if she was supposed to have the Keflex or the Macrobid. Pt did not want to get the Macrobid because of the expense- ~ $46. Autumn states that she advised the pt to call our office for direction on this question. Pt has not called, nor has she returned to pick up any medication. I asked Autumn to make a note that we prefer pt to have the Macrobid, but if it is cost prohibitive, the pt should take the Keflex and then notify us if her sx persist once the medication is gone. Autumn also provided another tel# [phone] which I tried and also has been disconnected. We will send certified letter to pt.

## 2012-03-17 NOTE — Progress Notes (Signed)
Urine culture shows E.coli resistant to PCN and intermediate sensitivity to Keflex; was sent home on Keflex. It is sensitive to Macrobid; Macrobid ordered for patient.

## 2012-03-17 NOTE — Telephone Encounter (Signed)
Attempted to call twice today and was not able to reach client.  Needs medication for UTI changed based on urine culture.  New medication, Macrobid, has been prescribed to client's pharmacy.

## 2012-03-17 NOTE — Telephone Encounter (Signed)
Called patient to inform of results and new rx, no answer, unable to leave voicemail at either number. If unable to reach by phone will send certified letter.

## 2012-03-22 ENCOUNTER — Encounter: Payer: Self-pay | Admitting: Advanced Practice Midwife

## 2012-03-23 ENCOUNTER — Encounter: Payer: Self-pay | Admitting: *Deleted

## 2012-05-19 NOTE — L&D Delivery Note (Signed)
I have seen the patient with the resident/student and agree with the above.  Hogan, Heather Donovan  

## 2012-05-19 NOTE — L&D Delivery Note (Signed)
Delivery Note At 8:59 PM a viable female was delivered via Vaginal, Spontaneous Delivery (Presentation: OA).  APGAR: 8,9 ; weight 10lb 5oz   Placenta status: Intact, Spontaneous.  Cord: 3 vessels with the following complications: None.    Anesthesia: Epidural  Episiotomy: None Lacerations: None Suture Repair: none Est. Blood Loss (mL): 350   Mom to postpartum.  Baby to nursery-stable. Plan to start anticoagulation at 24 ours postpartum w/ lovenox bridge and coumadin due to h/o of postpartum L ovarian vein thrombus in 2007.  Shelly Flatten, MD Family Medicine PGY-2 09/27/2012, 9:18 PM

## 2012-06-21 ENCOUNTER — Other Ambulatory Visit: Payer: Self-pay

## 2012-06-25 ENCOUNTER — Other Ambulatory Visit: Payer: Self-pay

## 2012-06-25 DIAGNOSIS — Z331 Pregnant state, incidental: Secondary | ICD-10-CM

## 2012-06-25 LAB — HIV ANTIBODY (ROUTINE TESTING W REFLEX): HIV: NONREACTIVE

## 2012-06-25 NOTE — Progress Notes (Unsigned)
PRENATAL LABS DONE TODAY Lindsey Paul 

## 2012-06-26 LAB — SICKLE CELL SCREEN: Sickle Cell Screen: NEGATIVE

## 2012-06-28 ENCOUNTER — Encounter: Payer: Self-pay | Admitting: Family Medicine

## 2012-06-28 ENCOUNTER — Ambulatory Visit (INDEPENDENT_AMBULATORY_CARE_PROVIDER_SITE_OTHER): Payer: Self-pay | Admitting: Family Medicine

## 2012-06-28 VITALS — BP 123/75 | Wt 233.0 lb

## 2012-06-28 DIAGNOSIS — B373 Candidiasis of vulva and vagina: Secondary | ICD-10-CM

## 2012-06-28 DIAGNOSIS — Z331 Pregnant state, incidental: Secondary | ICD-10-CM

## 2012-06-28 DIAGNOSIS — Z3480 Encounter for supervision of other normal pregnancy, unspecified trimester: Secondary | ICD-10-CM

## 2012-06-28 LAB — POCT WET PREP (WET MOUNT): Clue Cells Wet Prep Whiff POC: NEGATIVE

## 2012-06-28 LAB — OBSTETRIC PANEL
Antibody Screen: NEGATIVE
Basophils Relative: 0 % (ref 0–1)
Eosinophils Absolute: 0.1 10*3/uL (ref 0.0–0.7)
Eosinophils Relative: 1 % (ref 0–5)
HCT: 31 % — ABNORMAL LOW (ref 36.0–46.0)
Hemoglobin: 9.9 g/dL — ABNORMAL LOW (ref 12.0–15.0)
Hepatitis B Surface Ag: NEGATIVE
Lymphs Abs: 1.6 10*3/uL (ref 0.7–4.0)
MCH: 24.6 pg — ABNORMAL LOW (ref 26.0–34.0)
MCHC: 31.9 g/dL (ref 30.0–36.0)
MCV: 76.9 fL — ABNORMAL LOW (ref 78.0–100.0)
Monocytes Absolute: 0.3 10*3/uL (ref 0.1–1.0)
Monocytes Relative: 4 % (ref 3–12)
RBC: 4.03 MIL/uL (ref 3.87–5.11)
Rh Type: POSITIVE

## 2012-06-28 NOTE — Progress Notes (Signed)
Doing well overall. Taking PNV. Planning on BF. Previous pregnancies were uneventful. DVT postpartum in 2007. No postpartum complications w/ most recent pregnancies. All previous deliveries were SVD and at term Pt reports anemia w/ previous pregnancies and needing postpartum hospitalization due to anemia once before No FmHx of DM Painful after intercourse for 2 mo.  Dysuria intermittently for 2 mo. Denies abdominal pain. Denies n/v/d/c fever.  A/P 21yo [id number] hispanic female at 25.[redacted] wks gestation doing well. Planned pregnancy. In the [employer] program. No Fmhx of DM but pt is hispanic and obese. Pt is beyond any genetic screening. FOB is involved. - 1hr glucola sometime next week - Continue PNV - Pt to start OTC iron 325mg  due to anemia - GC/Chl, wet prep today - UA today - 4 wk f/u in OB clinic - Discussed contraception, and pt undecided but does not want any more babies - Vaginal Clotrimazole for yeast vaginitis - [place] at Dr. Elsie Stain office - PAP today

## 2012-06-28 NOTE — Patient Instructions (Addendum)
Thank you for coming in today Please come in for a sugar test next week Please follow up in our OB clinic in 4 weeks Please start taking a daily Iron pill Please go to Dr. Elsie Stain for your ultrasound Please come in for any additional concerns  Pregnancy - Second Trimester The second trimester of pregnancy (3 to 6 months) is a period of rapid growth for you and your baby. At the end of the sixth month, your baby is about 9 inches long and weighs 1 1/2 pounds. You will begin to feel the baby move between 18 and 20 weeks of the pregnancy. This is called quickening. Weight gain is faster. A clear fluid (colostrum) may leak out of your breasts. You may feel small contractions of the womb (uterus). This is known as false labor or Braxton-Hicks contractions. This is like a practice for labor when the baby is ready to be born. Usually, the problems with morning sickness have usually passed by the end of your first trimester. Some women develop small dark blotches (called cholasma, mask of pregnancy) on their face that usually goes away after the baby is born. Exposure to the sun makes the blotches worse. Acne may also develop in some pregnant women and pregnant women who have acne, may find that it goes away. PRENATAL EXAMS  Blood work may continue to be done during prenatal exams. These tests are done to check on your health and the probable health of your baby. Blood work is used to follow your blood levels (hemoglobin). Anemia (low hemoglobin) is common during pregnancy. Iron and vitamins are given to help prevent this. You will also be checked for diabetes between 24 and 28 weeks of the pregnancy. Some of the previous blood tests may be repeated.  The size of the uterus is measured during each visit. This is to make sure that the baby is continuing to grow properly according to the dates of the pregnancy.  Your blood pressure is checked every prenatal visit. This is to make sure you are not getting  toxemia.  Your urine is checked to make sure you do not have an infection, diabetes or protein in the urine.  Your weight is checked often to make sure gains are happening at the suggested rate. This is to ensure that both you and your baby are growing normally.  Sometimes, an ultrasound is performed to confirm the proper growth and development of the baby. This is a test which bounces harmless sound waves off the baby so your caregiver can more accurately determine due dates. Sometimes, a specialized test is done on the amniotic fluid surrounding the baby. This test is called an amniocentesis. The amniotic fluid is obtained by sticking a needle into the belly (abdomen). This is done to check the chromosomes in instances where there is a concern about possible genetic problems with the baby. It is also sometimes done near the end of pregnancy if an early delivery is required. In this case, it is done to help make sure the baby's lungs are mature enough for the baby to live outside of the womb. CHANGES OCCURING IN THE SECOND TRIMESTER OF PREGNANCY Your body goes through many changes during pregnancy. They vary from person to person. Talk to your caregiver about changes you notice that you are concerned about.  During the second trimester, you will likely have an increase in your appetite. It is normal to have cravings for certain foods. This varies from person to person and  pregnancy to pregnancy.  Your lower abdomen will begin to bulge.  You may have to urinate more often because the uterus and baby are pressing on your bladder. It is also common to get more bladder infections during pregnancy (pain with urination). You can help this by drinking lots of fluids and emptying your bladder before and after intercourse.  You may begin to get stretch marks on your hips, abdomen, and breasts. These are normal changes in the body during pregnancy. There are no exercises or medications to take that prevent  this change.  You may begin to develop swollen and bulging veins (varicose veins) in your legs. Wearing support hose, elevating your feet for 15 minutes, 3 to 4 times a day and limiting salt in your diet helps lessen the problem.  Heartburn may develop as the uterus grows and pushes up against the stomach. Antacids recommended by your caregiver helps with this problem. Also, eating smaller meals 4 to 5 times a day helps.  Constipation can be treated with a stool softener or adding bulk to your diet. Drinking lots of fluids, vegetables, fruits, and whole grains are helpful.  Exercising is also helpful. If you have been very active up until your pregnancy, most of these activities can be continued during your pregnancy. If you have been less active, it is helpful to start an exercise program such as walking.  Hemorrhoids (varicose veins in the rectum) may develop at the end of the second trimester. Warm sitz baths and hemorrhoid cream recommended by your caregiver helps hemorrhoid problems.  Backaches may develop during this time of your pregnancy. Avoid heavy lifting, wear low heal shoes and practice good posture to help with backache problems.  Some pregnant women develop tingling and numbness of their hand and fingers because of swelling and tightening of ligaments in the wrist (carpel tunnel syndrome). This goes away after the baby is born.  As your breasts enlarge, you may have to get a bigger bra. Get a comfortable, cotton, support bra. Do not get a nursing bra until the last month of the pregnancy if you will be nursing the baby.  You may get a dark line from your belly button to the pubic area called the linea nigra.  You may develop rosy cheeks because of increase blood flow to the face.  You may develop spider looking lines of the face, neck, arms and chest. These go away after the baby is born. HOME CARE INSTRUCTIONS   It is extremely important to avoid all smoking, herbs, alcohol,  and unprescribed drugs during your pregnancy. These chemicals affect the formation and growth of the baby. Avoid these chemicals throughout the pregnancy to ensure the delivery of a healthy infant.  Most of your home care instructions are the same as suggested for the first trimester of your pregnancy. Keep your caregiver's appointments. Follow your caregiver's instructions regarding medication use, exercise and diet.  During pregnancy, you are providing food for you and your baby. Continue to eat regular, well-balanced meals. Choose foods such as meat, fish, milk and other low fat dairy products, vegetables, fruits, and whole-grain breads and cereals. Your caregiver will tell you of the ideal weight gain.  A physical sexual relationship may be continued up until near the end of pregnancy if there are no other problems. Problems could include early (premature) leaking of amniotic fluid from the membranes, vaginal bleeding, abdominal pain, or other medical or pregnancy problems.  Exercise regularly if there are no restrictions. Check  with your caregiver if you are unsure of the safety of some of your exercises. The greatest weight gain will occur in the last 2 trimesters of pregnancy. Exercise will help you:  Control your weight.  Get you in shape for labor and delivery.  Lose weight after you have the baby.  Wear a good support or jogging bra for breast tenderness during pregnancy. This may help if worn during sleep. Pads or tissues may be used in the bra if you are leaking colostrum.  Do not use hot tubs, steam rooms or saunas throughout the pregnancy.  Wear your seat belt at all times when driving. This protects you and your baby if you are in an accident.  Avoid raw meat, uncooked cheese, cat litter boxes and soil used by cats. These carry germs that can cause birth defects in the baby.  The second trimester is also a good time to visit your dentist for your dental health if this has not  been done yet. Getting your teeth cleaned is OK. Use a soft toothbrush. Brush gently during pregnancy.  It is easier to loose urine during pregnancy. Tightening up and strengthening the pelvic muscles will help with this problem. Practice stopping your urination while you are going to the bathroom. These are the same muscles you need to strengthen. It is also the muscles you would use as if you were trying to stop from passing gas. You can practice tightening these muscles up 10 times a set and repeating this about 3 times per day. Once you know what muscles to tighten up, do not perform these exercises during urination. It is more likely to contribute to an infection by backing up the urine.  Ask for help if you have financial, counseling or nutritional needs during pregnancy. Your caregiver will be able to offer counseling for these needs as well as refer you for other special needs.  Your skin may become oily. If so, wash your face with mild soap, use non-greasy moisturizer and oil or cream based makeup. MEDICATIONS AND DRUG USE IN PREGNANCY  Take prenatal vitamins as directed. The vitamin should contain 1 milligram of folic acid. Keep all vitamins out of reach of children. Only a couple vitamins or tablets containing iron may be fatal to a baby or young child when ingested.  Avoid use of all medications, including herbs, over-the-counter medications, not prescribed or suggested by your caregiver. Only take over-the-counter or prescription medicines for pain, discomfort, or fever as directed by your caregiver. Do not use aspirin.  Let your caregiver also know about herbs you may be using.  Alcohol is related to a number of birth defects. This includes fetal alcohol syndrome. All alcohol, in any form, should be avoided completely. Smoking will cause low birth rate and premature babies.  Street or illegal drugs are very harmful to the baby. They are absolutely forbidden. A baby born to an addicted  mother will be addicted at birth. The baby will go through the same withdrawal an adult does. SEEK MEDICAL CARE IF:  You have any concerns or worries during your pregnancy. It is better to call with your questions if you feel they cannot wait, rather than worry about them. SEEK IMMEDIATE MEDICAL CARE IF:   An unexplained oral temperature above 102 F (38.9 C) develops, or as your caregiver suggests.  You have leaking of fluid from the vagina (birth canal). If leaking membranes are suspected, take your temperature and tell your caregiver of this when  you call.  There is vaginal spotting, bleeding, or passing clots. Tell your caregiver of the amount and how many pads are used. Light spotting in pregnancy is common, especially following intercourse.  You develop a bad smelling vaginal discharge with a change in the color from clear to white.  You continue to feel sick to your stomach (nauseated) and have no relief from remedies suggested. You vomit blood or coffee ground-like materials.  You lose more than 2 pounds of weight or gain more than 2 pounds of weight over 1 week, or as suggested by your caregiver.  You notice swelling of your face, hands, feet, or legs.  You get exposed to [place] measles and have never had them.  You are exposed to fifth disease or chickenpox.  You develop belly (abdominal) pain. Round ligament discomfort is a common non-cancerous (benign) cause of abdominal pain in pregnancy. Your caregiver still must evaluate you.  You develop a bad headache that does not go away.  You develop fever, diarrhea, pain with urination, or shortness of breath.  You develop visual problems, blurry, or double vision.  You fall or are in a car accident or any kind of trauma.  There is mental or physical violence at home. Document Released: 04/29/2001 Document Revised: 07/28/2011 Document Reviewed: 11/01/2008 [facility] Patient Information 2013 [place], [place].

## 2012-06-29 ENCOUNTER — Other Ambulatory Visit (INDEPENDENT_AMBULATORY_CARE_PROVIDER_SITE_OTHER): Payer: Self-pay

## 2012-06-29 DIAGNOSIS — Z331 Pregnant state, incidental: Secondary | ICD-10-CM

## 2012-06-29 DIAGNOSIS — B373 Candidiasis of vulva and vagina: Secondary | ICD-10-CM | POA: Insufficient documentation

## 2012-06-29 LAB — POCT URINALYSIS DIPSTICK
Bilirubin, UA: NEGATIVE
Glucose, UA: NEGATIVE
Ketones, UA: NEGATIVE
Leukocytes, UA: NEGATIVE
Nitrite, UA: NEGATIVE

## 2012-06-29 LAB — POCT UA - MICROSCOPIC ONLY

## 2012-06-29 LAB — GLUCOSE, CAPILLARY: Comment 1: 1

## 2012-06-29 MED ORDER — CLOTRIMAZOLE 2 % VA CREA: 1.0000 | TOPICAL_CREAM | Freq: Every day | VAGINAL | Status: DC

## 2012-06-29 NOTE — Assessment & Plan Note (Signed)
Treat w/ Clotrimazole topical 2% intravaginally QHS x 3 days

## 2012-06-29 NOTE — Progress Notes (Signed)
1 Hr OB glucose = 159 mg/dl,  3 hr GTT scheduled for Wed 06-30-12. Repeat urine done

## 2012-06-29 NOTE — Addendum Note (Signed)
Addended by: Jone Baseman D on: 06/29/2012 09:56 AM   Modules accepted: Orders

## 2012-06-30 ENCOUNTER — Other Ambulatory Visit (INDEPENDENT_AMBULATORY_CARE_PROVIDER_SITE_OTHER): Payer: Self-pay

## 2012-06-30 DIAGNOSIS — Z331 Pregnant state, incidental: Secondary | ICD-10-CM

## 2012-06-30 LAB — GLUCOSE TOLERANCE, 3 HOURS
Glucose Tolerance, Fasting: 111 mg/dL — ABNORMAL HIGH (ref 70–104)
Glucose, GTT - 3 Hour: 115 mg/dL (ref 70–144)

## 2012-06-30 NOTE — Progress Notes (Signed)
3 HR GTT DONE TODAY [place] Testa 

## 2012-07-01 NOTE — Progress Notes (Signed)
PHQ-9 score 7.

## 2012-07-12 ENCOUNTER — Telehealth: Payer: Self-pay | Admitting: Family Medicine

## 2012-07-12 NOTE — Telephone Encounter (Signed)
Wants to know results of her sugar test - pls advise

## 2012-07-12 NOTE — Telephone Encounter (Signed)
Abnormal at first but last 3 were normal.  Will forward to MD. Milas Gain, Maryjo Rochester

## 2012-07-13 NOTE — Telephone Encounter (Signed)
Please call pt w/ results if not already done. Thanks

## 2012-07-13 NOTE — Telephone Encounter (Signed)
Pt informed. Fleeger, Jessica Dawn  

## 2012-07-27 ENCOUNTER — Other Ambulatory Visit (INDEPENDENT_AMBULATORY_CARE_PROVIDER_SITE_OTHER): Payer: Self-pay | Admitting: Family Medicine

## 2012-07-27 ENCOUNTER — Ambulatory Visit (INDEPENDENT_AMBULATORY_CARE_PROVIDER_SITE_OTHER): Payer: Self-pay | Admitting: Family Medicine

## 2012-07-27 VITALS — BP 118/75 | Temp 98.4°F | Wt 237.0 lb

## 2012-07-27 DIAGNOSIS — Z3483 Encounter for supervision of other normal pregnancy, third trimester: Secondary | ICD-10-CM

## 2012-07-27 DIAGNOSIS — Z23 Encounter for immunization: Secondary | ICD-10-CM

## 2012-07-27 DIAGNOSIS — Z3480 Encounter for supervision of other normal pregnancy, unspecified trimester: Secondary | ICD-10-CM | POA: Insufficient documentation

## 2012-07-27 LAB — CBC
HCT: 29.7 % — ABNORMAL LOW (ref 36.0–46.0)
MCV: 73.5 fL — ABNORMAL LOW (ref 78.0–100.0)
RBC: 4.04 MIL/uL (ref 3.87–5.11)
RDW: 16.6 % — ABNORMAL HIGH (ref 11.5–15.5)
WBC: 8.6 10*3/uL (ref 4.0–10.5)

## 2012-07-27 NOTE — Progress Notes (Signed)
Doing well. Complaining of light headedness intermittently . On Sunday felt like she was going to faint so she sat down. Taking a PNV. Has not started the iron. Required trnasfusion w/ a previous pregnancy Still w/ vaginal odor and discharge but has not used the clotrimazole.  Mild intermittent contraction, denies vaginal pain or bleeding.  Would like Tdap today   A/P 21yo [id number] hispanic female at 29.[redacted] wks gestation doing well. Planned pregnancy. In the [employer] program.  FOB is involved. Anemic and not taking Iron yet.  - continue PNV - Pt will start Iron 325mg  daily. (h/o transfusion w/ previous pregnancy) - Tdap today - f/u in 2 wks w/ OB clinic (missed her last OB clinic appt) - Continue to try and obtain the orange card - Discussed contraception, and pt undecided but does not want any more babies. Address further at next appt - Has not used clotrimazole and still w/ symptoms. Pt to start using Clotrimazole for yeast vaginitis - [place] at Dr. Elsie Stain office if pt desires

## 2012-07-27 NOTE — Patient Instructions (Addendum)
Thank you for coiming in today You are doing well We will check labs to see if you are still anemic Pelase start taking Iron tablets (325mg ) daily. These are best taken with foods Pelase come back to our OB clinic in 2 weeks.  If your swelling gets worse of if you develop other symptoms feel free to call or come in early for an appointment Please finish your application for the orange card Have a great day.    Pregnancy - First Trimester During sexual intercourse, millions of sperm go into the vagina. Only 1 sperm will penetrate and fertilize the female egg while it is in the Fallopian tube. One week later, the fertilized egg implants into the wall of the uterus. An embryo begins to develop into a baby. At 6 to 8 weeks, the eyes and face are formed and the heartbeat can be seen on ultrasound. At the end of 12 weeks (first trimester), all the baby's organs are formed. Now that you are pregnant, you will want to do everything you can to have a healthy baby. Two of the most important things are to get good prenatal care and follow your caregiver's instructions. Prenatal care is all the medical care you receive before the baby's birth. It is given to prevent, find, and treat problems during the pregnancy and childbirth. PRENATAL EXAMS  During prenatal visits, your weight, blood pressure and urine are checked. This is done to make sure you are healthy and progressing normally during the pregnancy.  A pregnant woman should gain 25 to 35 pounds during the pregnancy. However, if you are over weight or underweight, your caregiver will advise you regarding your weight.  Your caregiver will ask and answer questions for you.  Blood work, cervical cultures, other necessary tests and a Pap test are done during your prenatal exams. These tests are done to check on your health and the probable health of your baby. Tests are strongly recommended and done for HIV with your permission. This is the virus that causes  AIDS. These tests are done because medications can be given to help prevent your baby from being born with this infection should you have been infected without knowing it. Blood work is also used to find out your blood type, previous infections and follow your blood levels (hemoglobin).  Low hemoglobin (anemia) is common during pregnancy. Iron and vitamins are given to help prevent this. Later in the pregnancy, blood tests for diabetes will be done along with any other tests if any problems develop. You may need tests to make sure you and the baby are doing well.  You may need other tests to make sure you and the baby are doing well. CHANGES DURING THE FIRST TRIMESTER (THE FIRST 3 MONTHS OF PREGNANCY) Your body goes through many changes during pregnancy. They vary from person to person. Talk to your caregiver about changes you notice and are concerned about. Changes can include:  Your menstrual period stops.  The egg and sperm carry the genes that determine what you look like. Genes from you and your partner are forming a baby. The female genes determine whether the baby is a boy or a girl.  Your body increases in girth and you may feel bloated.  Feeling sick to your stomach (nauseous) and throwing up (vomiting). If the vomiting is uncontrollable, call your caregiver.  Your breasts will begin to enlarge and become tender.  Your nipples may stick out more and become darker.  The need to  urinate more. Painful urination may mean you have a bladder infection.  Tiring easily.  Loss of appetite.  Cravings for certain kinds of food.  At first, you may gain or lose a couple of pounds.  You may have changes in your emotions from day to day (excited to be pregnant or concerned something may go wrong with the pregnancy and baby).  You may have more vivid and strange dreams. HOME CARE INSTRUCTIONS   It is very important to avoid all smoking, alcohol and un-prescribed drugs during your pregnancy.  These affect the formation and growth of the baby. Avoid chemicals while pregnant to ensure the delivery of a healthy infant.  Start your prenatal visits by the 12th week of pregnancy. They are usually scheduled monthly at first, then more often in the last 2 months before delivery. Keep your caregiver's appointments. Follow your caregiver's instructions regarding medication use, blood and lab tests, exercise, and diet.  During pregnancy, you are providing food for you and your baby. Eat regular, well-balanced meals. Choose foods such as meat, fish, milk and other low fat dairy products, vegetables, fruits, and whole-grain breads and cereals. Your caregiver will tell you of the ideal weight gain.  You can help morning sickness by keeping soda crackers at the bedside. Eat a couple before arising in the morning. You may want to use the crackers without salt on them.  Eating 4 to 5 small meals rather than 3 large meals a day also may help the nausea and vomiting.  Drinking liquids between meals instead of during meals also seems to help nausea and vomiting.  A physical sexual relationship may be continued throughout pregnancy if there are no other problems. Problems may be early (premature) leaking of amniotic fluid from the membranes, vaginal bleeding, or belly (abdominal) pain.  Exercise regularly if there are no restrictions. Check with your caregiver or physical therapist if you are unsure of the safety of some of your exercises. Greater weight gain will occur in the last 2 trimesters of pregnancy. Exercising will help:  Control your weight.  Keep you in shape.  Prepare you for labor and delivery.  Help you lose your pregnancy weight after you deliver your baby.  Wear a good support or jogging bra for breast tenderness during pregnancy. This may help if worn during sleep too.  Ask when prenatal classes are available. Begin classes when they are offered.  Do not use hot tubs, steam rooms  or saunas.  Wear your seat belt when driving. This protects you and your baby if you are in an accident.  Avoid raw meat, uncooked cheese, cat litter boxes and soil used by cats throughout the pregnancy. These carry germs that can cause birth defects in the baby.  The first trimester is a good time to visit your dentist for your dental health. Getting your teeth cleaned is OK. Use a softer toothbrush and brush gently during pregnancy.  Ask for help if you have financial, counseling or nutritional needs during pregnancy. Your caregiver will be able to offer counseling for these needs as well as refer you for other special needs.  Do not take any medications or herbs unless told by your caregiver.  Inform your caregiver if there is any mental or physical domestic violence.  Make a list of emergency phone numbers of family, friends, hospital, and police and fire departments.  Write down your questions. Take them to your prenatal visit.  Do not douche.  Do not cross your  legs.  If you have to stand for long periods of time, rotate you feet or take small steps in a circle.  You may have more vaginal secretions that may require a sanitary pad. Do not use tampons or scented sanitary pads. MEDICATIONS AND DRUG USE IN PREGNANCY  Take prenatal vitamins as directed. The vitamin should contain 1 milligram of folic acid. Keep all vitamins out of reach of children. Only a couple vitamins or tablets containing iron may be fatal to a baby or young child when ingested.  Avoid use of all medications, including herbs, over-the-counter medications, not prescribed or suggested by your caregiver. Only take over-the-counter or prescription medicines for pain, discomfort, or fever as directed by your caregiver. Do not use aspirin, ibuprofen, or naproxen unless directed by your caregiver.  Let your caregiver also know about herbs you may be using.  Alcohol is related to a number of birth defects. This  includes fetal alcohol syndrome. All alcohol, in any form, should be avoided completely. Smoking will cause low birth rate and premature babies.  Street or illegal drugs are very harmful to the baby. They are absolutely forbidden. A baby born to an addicted mother will be addicted at birth. The baby will go through the same withdrawal an adult does.  Let your caregiver know about any medications that you have to take and for what reason you take them. MISCARRIAGE IS COMMON DURING PREGNANCY A miscarriage does not mean you did something wrong. It is not a reason to worry about getting pregnant again. Your caregiver will help you with questions you may have. If you have a miscarriage, you may need minor surgery. SEEK MEDICAL CARE IF:  You have any concerns or worries during your pregnancy. It is better to call with your questions if you feel they cannot wait, rather than worry about them. SEEK IMMEDIATE MEDICAL CARE IF:   An unexplained oral temperature above 102 F (38.9 C) develops, or as your caregiver suggests.  You have leaking of fluid from the vagina (birth canal). If leaking membranes are suspected, take your temperature and inform your caregiver of this when you call.  There is vaginal spotting or bleeding. Notify your caregiver of the amount and how many pads are used.  You develop a bad smelling vaginal discharge with a change in the color.  You continue to feel sick to your stomach (nauseated) and have no relief from remedies suggested. You vomit blood or coffee ground-like materials.  You lose more than 2 pounds of weight in 1 week.  You gain more than 2 pounds of weight in 1 week and you notice swelling of your face, hands, feet, or legs.  You gain 5 pounds or more in 1 week (even if you do not have swelling of your hands, face, legs, or feet).  You get exposed to [place] measles and have never had them.  You are exposed to fifth disease or chickenpox.  You develop belly  (abdominal) pain. Round ligament discomfort is a common non-cancerous (benign) cause of abdominal pain in pregnancy. Your caregiver still must evaluate this.  You develop headache, fever, diarrhea, pain with urination, or shortness of breath.  You fall or are in a car accident or have any kind of trauma.  There is mental or physical violence in your home. Document Released: 04/29/2001 Document Revised: 07/28/2011 Document Reviewed: 10/31/2008 [facility] Patient Information 2013 [place], [place].

## 2012-08-03 ENCOUNTER — Encounter (HOSPITAL_COMMUNITY): Payer: Self-pay | Admitting: *Deleted

## 2012-08-03 ENCOUNTER — Inpatient Hospital Stay (HOSPITAL_COMMUNITY)
Admission: AD | Admit: 2012-08-03 | Discharge: 2012-08-03 | Disposition: A | Discharge status: Home / Self Care | Payer: Self-pay | Source: Ambulatory Visit | Attending: Obstetrics & Gynecology | Admitting: Obstetrics & Gynecology

## 2012-08-03 DIAGNOSIS — R42 Dizziness and giddiness: Secondary | ICD-10-CM | POA: Insufficient documentation

## 2012-08-03 DIAGNOSIS — O99891 Other specified diseases and conditions complicating pregnancy: Secondary | ICD-10-CM | POA: Insufficient documentation

## 2012-08-03 DIAGNOSIS — O4700 False labor before 37 completed weeks of gestation, unspecified trimester: Secondary | ICD-10-CM | POA: Insufficient documentation

## 2012-08-03 DIAGNOSIS — R0602 Shortness of breath: Secondary | ICD-10-CM

## 2012-08-03 LAB — CBC
HCT: 29.5 % — ABNORMAL LOW (ref 36.0–46.0)
Hemoglobin: 9.3 g/dL — ABNORMAL LOW (ref 12.0–15.0)
MCHC: 31.5 g/dL (ref 30.0–36.0)
MCV: 73.9 fL — ABNORMAL LOW (ref 78.0–100.0)

## 2012-08-03 MED ORDER — FERROUS SULFATE 325 (65 FE) MG PO TABS: 325.0000 mg | ORAL_TABLET | Freq: Two times a day (BID) | ORAL | Status: DC

## 2012-08-03 NOTE — MAU Note (Signed)
Ongoing contractions, off and on. Taken to rm

## 2012-08-03 NOTE — MAU Provider Note (Signed)
History     CSN: [id number]  Arrival date and time: 08/03/12 1530   First Provider Initiated Contact with Patient 08/03/12 1653      No chief complaint on file.  HPI Pt is a 22 y/o [id number] [place] [redacted]w[redacted]d female who presents today for lightheadedness/SOB and contractions.  Lightheadedness/SOB: She reports these symptoms have been going on for about 3 weeks, but have gotten increasingly worse for the past 2 weeks. She says that it has gotten to the point that she can't walk across her home without becoming dyspneic and that she will have to stop and brace herself on something and rest. She is afraid that she is going to lose consciousness. She just started taking iron supplements yesterday at the advice of her PCP. She has not experienced these symptoms before in any of her previous pregnancies. She denies any recent travel or calf swelling/tenderness. She has been eating/drinking normally. She has history of anemia during past pregnancies and once had to be transfused postpartum. She says that about 2 weeks after the transfusion she was found to have a blood clot in her left ovary and was treated with blood thinners.  Contractions: She reports intermittent lower abdominal pain over the past several weeks, as well as lower back pain. She says that standing up from a seated position makes it worse. She also reports some belly tightening and pressure in her pelvis. She denies any vaginal bleeding or fluid leakage.    All of her past pregnancies were uneventful and she delivered all vaginally. She doesn't currently have a birth plan; assumes her PCP will be doing her delivery. She has not had an ultrasound.  Past Medical History  Diagnosis Date  . Medical history non-contributory     Past Surgical History  Procedure Laterality Date  . Dilation and curettage of uterus      Family History  Problem Relation Age of Onset  . Diabetes Neg Hx   . Cancer Neg Hx   . Hypertension Neg Hx   .  Hyperlipidemia Neg Hx     History  Substance Use Topics  . Smoking status: Never Smoker   . Smokeless tobacco: Not on file  . Alcohol Use: No    Allergies: No Known Allergies  Prescriptions prior to admission  Medication Sig Dispense Refill  . clotrimazole (GYNE-LOTRIMIN 3) 2 % vaginal cream Place 1 Applicatorful vaginally at bedtime. Apply for 3 days  21 g  0  . ferrous sulfate 325 (65 FE) MG tablet Take 325 mg by mouth daily with breakfast.      . Prenatal Vit-Fe Fumarate-FA (PRENATAL MULTIVITAMIN) TABS Take 1 tablet by mouth daily at 12 noon.      . [DISCONTINUED] nitrofurantoin, macrocrystal-monohydrate, (MACROBID) 100 MG capsule Take 1 capsule (100 mg total) by mouth 2 (two) times daily.  14 capsule  1   Results for orders placed during the hospital encounter of 08/03/12 (from the past 24 hour(s))  CBC     Status: Abnormal   Collection Time    08/03/12  5:00 PM      Result Value Range   WBC 8.1  4.0 - 10.5 K/uL   RBC 3.99  3.87 - 5.11 MIL/uL   Hemoglobin 9.3 (*) 12.0 - 15.0 g/dL   HCT 28.4 (*) 13.2 - 44.0 %   MCV 73.9 (*) 78.0 - 100.0 fL   MCH 23.3 (*) 26.0 - 34.0 pg   MCHC 31.5  30.0 - 36.0 g/dL   RDW  16.4 (*) 11.5 - 15.5 %   Platelets 201  150 - 400 K/uL  GLUCOSE, CAPILLARY     Status: None   Collection Time    08/03/12  5:36 PM      Result Value Range   Glucose-Capillary 94  70 - 99 mg/dL   ROS A [id number] review of systems was asked and positive pertinent and negatives are discussed in the HPI.  Physical Exam   Blood pressure 111/55, pulse 102, temperature 98.4 F (36.9 C), temperature source Oral, resp. rate 20, last menstrual period 01/04/2012, unknown if currently breastfeeding.  Physical Exam  Constitutional: She appears well-developed and well-nourished. No distress.  Neck: No thyromegaly present.  Cardiovascular: Regular rhythm and normal heart sounds.   Mild tachycardia.  Respiratory: Effort normal and breath sounds normal. No respiratory distress.  She has no wheezes. She has no rales.  GI: She exhibits no distension. There is no tenderness. There is no rebound and no guarding.  Fundal height 39 cm.  Musculoskeletal: She exhibits no edema and no tenderness (No calf swelling, redness, tenderness.).  Lymphadenopathy:    She has no cervical adenopathy.  Skin: Skin is warm and dry.    MAU Course  Procedures  Assessment and Plan  1. Lightheadedness/Dyspnea - CBC virtually unchanged from CBC on 3/11. Hemoglobin is slightly decreased at 9.3, hematocrit is 29.5. Although her hemoglobin is not significantly low her symptoms sound consistent with anemia. Recommending that she increase her iron supplement to twice daily.  - POC blood glucose is normal - SpO2 normal at rest and while ambulating - PE unlikely due to long and drawn-out nature of her symptoms.  2. Contractions - Sound to be far apart - Not reflected on monitor - Cervix is closed on exam - Advised to return if they worsen  Lorna Dibble, PA-S 08/03/2012, 4:59 PM   I was present for the exam and agree with above.  [place], [place] 08/08/2012 9:29 PM

## 2012-08-09 NOTE — MAU Provider Note (Signed)
Attestation of Attending Supervision of Advanced Practitioner (PA/CNM/NP): Evaluation and management procedures were performed by the Advanced Practitioner under my supervision and collaboration.  I have reviewed the Advanced Practitioner's note and chart, and I agree with the management and plan.  Stephen Baruch, MD, FACOG Attending Obstetrician & Gynecologist Faculty Practice, [facility]  

## 2012-08-26 ENCOUNTER — Ambulatory Visit (INDEPENDENT_AMBULATORY_CARE_PROVIDER_SITE_OTHER): Payer: Self-pay | Admitting: Family Medicine

## 2012-08-26 VITALS — BP 108/69 | Temp 98.3°F | Wt 245.0 lb

## 2012-08-26 DIAGNOSIS — O2340 Unspecified infection of urinary tract in pregnancy, unspecified trimester: Secondary | ICD-10-CM | POA: Insufficient documentation

## 2012-08-26 DIAGNOSIS — O26849 Uterine size-date discrepancy, unspecified trimester: Secondary | ICD-10-CM

## 2012-08-26 DIAGNOSIS — O2343 Unspecified infection of urinary tract in pregnancy, third trimester: Secondary | ICD-10-CM

## 2012-08-26 DIAGNOSIS — Z3483 Encounter for supervision of other normal pregnancy, third trimester: Secondary | ICD-10-CM

## 2012-08-26 DIAGNOSIS — IMO0002 Reserved for concepts with insufficient information to code with codable children: Secondary | ICD-10-CM

## 2012-08-26 DIAGNOSIS — Z3480 Encounter for supervision of other normal pregnancy, unspecified trimester: Secondary | ICD-10-CM

## 2012-08-26 DIAGNOSIS — O26843 Uterine size-date discrepancy, third trimester: Secondary | ICD-10-CM | POA: Insufficient documentation

## 2012-08-26 DIAGNOSIS — O2390 Unspecified genitourinary tract infection in pregnancy, unspecified trimester: Secondary | ICD-10-CM

## 2012-08-26 MED ORDER — CEPHALEXIN 500 MG PO CAPS: 500.0000 mg | ORAL_CAPSULE | Freq: Two times a day (BID) | ORAL | Status: DC

## 2012-08-26 NOTE — Assessment & Plan Note (Signed)
Patient at 33.4 weeks, but measuring 40 cm on exam today. Discussed importance of obtaining US to determine exact sizing and to determine if there are any abnormalities. Plan: will order diagnostic US and have it scheduled at [facility] hospital.

## 2012-08-26 NOTE — Patient Instructions (Signed)
Pregnancy - Third Trimester  The third trimester of pregnancy (the last 3 months) is a period of the most rapid growth for you and your baby. The baby approaches a length of 20 inches and a weight of 6 to 10 pounds. The baby is adding on fat and getting ready for life outside your body. While inside, babies have periods of sleeping and waking, suck their thumbs, and hiccups. You can often feel small contractions of the uterus. This is false labor. It is also called Braxton-Hicks contractions. This is like a practice for labor. The usual problems in this stage of pregnancy include more difficulty breathing, swelling of the hands and feet from water retention, and having to urinate more often because of the uterus and baby pressing on your bladder.   PRENATAL EXAMS  · Blood work may continue to be done during prenatal exams. These tests are done to check on your health and the probable health of your baby. Blood work is used to follow your blood levels (hemoglobin). Anemia (low hemoglobin) is common during pregnancy. Iron and vitamins are given to help prevent this. You may also continue to be checked for diabetes. Some of the past blood tests may be done again.  · The size of the uterus is measured during each visit. This makes sure your baby is growing properly according to your pregnancy dates.  · Your blood pressure is checked every prenatal visit. This is to make sure you are not getting toxemia.  · Your urine is checked every prenatal visit for infection, diabetes and protein.  · Your weight is checked at each visit. This is done to make sure gains are happening at the suggested rate and that you and your baby are growing normally.  · Sometimes, an ultrasound is performed to confirm the position and the proper growth and development of the baby. This is a test done that bounces harmless sound waves off the baby so your caregiver can more accurately determine due dates.  · Discuss the type of pain medication and  anesthesia you will have during your labor and delivery.  · Discuss the possibility and anesthesia if a Cesarean Section might be necessary.  · Inform your caregiver if there is any mental or physical violence at home.  Sometimes, a specialized non-stress test, contraction stress test and biophysical profile are done to make sure the baby is not having a problem. Checking the amniotic fluid surrounding the baby is called an amniocentesis. The amniotic fluid is removed by sticking a needle into the belly (abdomen). This is sometimes done near the end of pregnancy if an early delivery is required. In this case, it is done to help make sure the baby's lungs are mature enough for the baby to live outside of the womb. If the lungs are not mature and it is unsafe to deliver the baby, an injection of cortisone medication is given to the mother 1 to 2 days before the delivery. This helps the baby's lungs mature and makes it safer to deliver the baby.  CHANGES OCCURING IN THE THIRD TRIMESTER OF PREGNANCY  Your body goes through many changes during pregnancy. They vary from person to person. Talk to your caregiver about changes you notice and are concerned about.  · During the last trimester, you have probably had an increase in your appetite. It is normal to have cravings for certain foods. This varies from person to person and pregnancy to pregnancy.  · You may begin to   get stretch marks on your hips, abdomen, and breasts. These are normal changes in the body during pregnancy. There are no exercises or medications to take which prevent this change.  · Constipation may be treated with a stool softener or adding bulk to your diet. Drinking lots of fluids, fiber in vegetables, fruits, and whole grains are helpful.  · Exercising is also helpful. If you have been very active up until your pregnancy, most of these activities can be continued during your pregnancy. If you have been less active, it is helpful to start an exercise  program such as walking. Consult your caregiver before starting exercise programs.  · Avoid all smoking, alcohol, un-prescribed drugs, herbs and "street drugs" during your pregnancy. These chemicals affect the formation and growth of the baby. Avoid chemicals throughout the pregnancy to ensure the delivery of a healthy infant.  · Backache, varicose veins and hemorrhoids may develop or get worse.  · You will tire more easily in the third trimester, which is normal.  · The baby's movements may be stronger and more often.  · You may become short of breath easily.  · Your belly button may stick out.  · A yellow discharge may leak from your breasts called colostrum.  · You may have a bloody mucus discharge. This usually occurs a few days to a week before labor begins.  HOME CARE INSTRUCTIONS   · Keep your caregiver's appointments. Follow your caregiver's instructions regarding medication use, exercise, and diet.  · During pregnancy, you are providing food for you and your baby. Continue to eat regular, well-balanced meals. Choose foods such as meat, fish, milk and other low fat dairy products, vegetables, fruits, and whole-grain breads and cereals. Your caregiver will tell you of the ideal weight gain.  · A physical sexual relationship may be continued throughout pregnancy if there are no other problems such as early (premature) leaking of amniotic fluid from the membranes, vaginal bleeding, or belly (abdominal) pain.  · Exercise regularly if there are no restrictions. Check with your caregiver if you are unsure of the safety of your exercises. Greater weight gain will occur in the last 2 trimesters of pregnancy. Exercising helps:  · Control your weight.  · Get you in shape for labor and delivery.  · You lose weight after you deliver.  · Rest a lot with legs elevated, or as needed for leg cramps or low back pain.  · Wear a good support or jogging bra for breast tenderness during pregnancy. This may help if worn during  sleep. Pads or tissues may be used in the bra if you are leaking colostrum.  · Do not use hot tubs, steam rooms, or saunas.  · Wear your seat belt when driving. This protects you and your baby if you are in an accident.  · Avoid raw meat, cat litter boxes and soil used by cats. These carry germs that can cause birth defects in the baby.  · It is easier to loose urine during pregnancy. Tightening up and strengthening the pelvic muscles will help with this problem. You can practice stopping your urination while you are going to the bathroom. These are the same muscles you need to strengthen. It is also the muscles you would use if you were trying to stop from passing gas. You can practice tightening these muscles up 10 times a set and repeating this about 3 times per day. Once you know what muscles to tighten up, do not perform these   exercises during urination. It is more likely to cause an infection by backing up the urine.  · Ask for help if you have financial, counseling or nutritional needs during pregnancy. Your caregiver will be able to offer counseling for these needs as well as refer you for other special needs.  · Make a list of emergency phone numbers and have them available.  · Plan on getting help from family or friends when you go home from the hospital.  · Make a trial run to the hospital.  · Take prenatal classes with the father to understand, practice and ask questions about the labor and delivery.  · Prepare the baby's room/nursery.  · Do not travel out of the city unless it is absolutely necessary and with the advice of your caregiver.  · Wear only low or no heal shoes to have better balance and prevent falling.  MEDICATIONS AND DRUG USE IN PREGNANCY  · Take prenatal vitamins as directed. The vitamin should contain 1 milligram of folic acid. Keep all vitamins out of reach of children. Only a couple vitamins or tablets containing iron may be fatal to a baby or young child when ingested.  · Avoid use  of all medications, including herbs, over-the-counter medications, not prescribed or suggested by your caregiver. Only take over-the-counter or prescription medicines for pain, discomfort, or fever as directed by your caregiver. Do not use aspirin, ibuprofen (Motrin®, Advil®, Nuprin®) or naproxen (Aleve®) unless OK'd by your caregiver.  · Let your caregiver also know about herbs you may be using.  · Alcohol is related to a number of birth defects. This includes fetal alcohol syndrome. All alcohol, in any form, should be avoided completely. Smoking will cause low birth rate and premature babies.  · Street/illegal drugs are very harmful to the baby. They are absolutely forbidden. A baby born to an addicted mother will be addicted at birth. The baby will go through the same withdrawal an adult does.  SEEK MEDICAL CARE IF:  You have any concerns or worries during your pregnancy. It is better to call with your questions if you feel they cannot wait, rather than worry about them.  DECISIONS ABOUT CIRCUMCISION  You may or may not know the sex of your baby. If you know your baby is a boy, it may be time to think about circumcision. Circumcision is the removal of the foreskin of the penis. This is the skin that covers the sensitive end of the penis. There is no proven medical need for this. Often this decision is made on what is popular at the time or based upon religious beliefs and social issues. You can discuss these issues with your caregiver or pediatrician.  SEEK IMMEDIATE MEDICAL CARE IF:   · An unexplained oral temperature above 102° F (38.9° C) develops, or as your caregiver suggests.  · You have leaking of fluid from the vagina (birth canal). If leaking membranes are suspected, take your temperature and tell your caregiver of this when you call.  · There is vaginal spotting, bleeding or passing clots. Tell your caregiver of the amount and how many pads are used.  · You develop a bad smelling vaginal discharge with  a change in the color from clear to white.  · You develop vomiting that lasts more than 24 hours.  · You develop chills or fever.  · You develop shortness of breath.  · You develop burning on urination.  · You loose more than 2 pounds of weight   or gain more than 2 pounds of weight or as suggested by your caregiver.  · You notice sudden swelling of your face, hands, and feet or legs.  · You develop belly (abdominal) pain. Round ligament discomfort is a common non-cancerous (benign) cause of abdominal pain in pregnancy. Your caregiver still must evaluate you.  · You develop a severe headache that does not go away.  · You develop visual problems, blurred or double vision.  · If you have not felt your baby move for more than 1 hour. If you think the baby is not moving as much as usual, eat something with sugar in it and lie down on your left side for an hour. The baby should move at least 4 to 5 times per hour. Call right away if your baby moves less than that.  · You fall, are in a car accident or any kind of trauma.  · There is mental or physical violence at home.  Document Released: 04/29/2001 Document Revised: 07/28/2011 Document Reviewed: 11/01/2008  [employer]® Patient Information ©2013 [employer].

## 2012-08-26 NOTE — Progress Notes (Signed)
Patient seen in [facility] today with Dr. Birdie Sons, I agree with his assessment and plan for today.  Specific issues addressed in this visit include: 1) Prior UCx from 02/07 with 75,000cfu/mL E.coli.  She continues to have dysuria, which has been ongoing.  For treatment today with Keflex for 1 week, then will need urine culture at next visit for TOC. 2) Size>dates by measurements today.  She did have an early dating [place], but she has not had an anatomy scan, (cost prohibitive).  We have discussed with her the reasoning for this diagnostic study to be done at [facility], given marked size>dates.  Reviewed her glucola testing results.  Her CBC/HIV/RPR labs were done after 24 weeks and not repeated (all were negative).  Will need GBS and cervical cultures at next visit with her primary physician in 2 weeks (between 35 and 36 weeks).  3) Face-to-face application of PHQ2, negative responses for both questions.  She also denies feeling unsafe or being the victim of domestic violence.  Denies ever being a smoker, no drug use and no alcohol use.  No smokers in the home.  This suffices for the Pregnancy Medical Home screening (patient does not have Medicaid, therefore form does not need to be given to [facility] case worker).  4) Regarding anemia, patient continues to take FeSO4 2 tablets daily, without GI upset or constipation.  Follow up in 2 weeks with her primary physician, Dr Konrad Dolores. Paula Compton, MD

## 2012-08-26 NOTE — Progress Notes (Signed)
Lindsey Paul is a 22 y.o. [id number] at [redacted]w[redacted]d for routine follow up.  She reports some burning with urination. Denies alcohol, tobacco, illicit drug use. Denies depression and loss of interest.  See flow sheet for details.  A/P: Pregnancy at [redacted]w[redacted]d.  Doing well.   Pregnancy issues include size greater than dates. Discussed need for [place] given patient measuring at 40 cm today and need to confirm size of baby as well as to determine if there could be any complicating factors for delivery. Will schedule for [place] at [facility] hospital. Additional issues include positive UCx (E coli) not treated. Will treat with keflex 500 mg BID for 7 days.  At next visit will need test of cure for UTI and GBS. Will need to f/u US given size greater than dates.  Tdapwas given at previous visit. GBS/GC/CZ testing was not performed today.  Preterm labor precautions reviewed. Kick counts reviewed. Follow up 2 weeks.

## 2012-08-26 NOTE — Assessment & Plan Note (Signed)
Patient with previous UCx with E coli. Not treated.  Plan: Will treat with keflex 500 mg BID x7 days.

## 2012-08-29 LAB — URINE CULTURE: Colony Count: >=100000

## 2012-08-30 ENCOUNTER — Telehealth: Payer: Self-pay | Admitting: Family Medicine

## 2012-08-30 DIAGNOSIS — O2343 Unspecified infection of urinary tract in pregnancy, third trimester: Secondary | ICD-10-CM

## 2012-08-30 NOTE — Telephone Encounter (Signed)
Called patient to report results of UCx done 4/10, which shows persistence of E coli.  She is taking the Keflex that was prescribed and reports improvement in her dysuria.  She is instructed to complete the full course of Keflex.  We will perform another urine cx for test-of-cure when she comes for her next visit. Paula Compton, MD

## 2012-09-01 ENCOUNTER — Ambulatory Visit (HOSPITAL_COMMUNITY)
Admission: RE | Admit: 2012-09-01 | Discharge: 2012-09-01 | Disposition: A | Discharge status: Home / Self Care | Payer: Self-pay | Source: Ambulatory Visit | Attending: Family Medicine | Admitting: Family Medicine

## 2012-09-01 DIAGNOSIS — Z1389 Encounter for screening for other disorder: Secondary | ICD-10-CM | POA: Insufficient documentation

## 2012-09-01 DIAGNOSIS — IMO0002 Reserved for concepts with insufficient information to code with codable children: Secondary | ICD-10-CM

## 2012-09-01 DIAGNOSIS — Z363 Encounter for antenatal screening for malformations: Secondary | ICD-10-CM | POA: Insufficient documentation

## 2012-09-01 DIAGNOSIS — O3660X0 Maternal care for excessive fetal growth, unspecified trimester, not applicable or unspecified: Secondary | ICD-10-CM | POA: Insufficient documentation

## 2012-09-01 DIAGNOSIS — O358XX0 Maternal care for other (suspected) fetal abnormality and damage, not applicable or unspecified: Secondary | ICD-10-CM | POA: Insufficient documentation

## 2012-09-15 ENCOUNTER — Encounter: Payer: Self-pay | Admitting: Family Medicine

## 2012-09-20 ENCOUNTER — Ambulatory Visit (INDEPENDENT_AMBULATORY_CARE_PROVIDER_SITE_OTHER): Payer: Self-pay | Admitting: *Deleted

## 2012-09-20 ENCOUNTER — Encounter: Payer: Self-pay | Admitting: Family Medicine

## 2012-09-20 ENCOUNTER — Inpatient Hospital Stay (HOSPITAL_COMMUNITY)
Admission: AD | Admit: 2012-09-20 | Discharge: 2012-09-20 | Disposition: A | Discharge status: Home / Self Care | Payer: Self-pay | Source: Ambulatory Visit | Attending: Family Medicine | Admitting: Family Medicine

## 2012-09-20 ENCOUNTER — Ambulatory Visit (INDEPENDENT_AMBULATORY_CARE_PROVIDER_SITE_OTHER): Payer: Self-pay | Admitting: Family Medicine

## 2012-09-20 VITALS — BP 114/67

## 2012-09-20 VITALS — BP 109/73 | Temp 97.9°F | Wt 252.2 lb

## 2012-09-20 DIAGNOSIS — Z3483 Encounter for supervision of other normal pregnancy, third trimester: Secondary | ICD-10-CM

## 2012-09-20 DIAGNOSIS — O480 Post-term pregnancy: Secondary | ICD-10-CM

## 2012-09-20 DIAGNOSIS — O2343 Unspecified infection of urinary tract in pregnancy, third trimester: Secondary | ICD-10-CM

## 2012-09-20 DIAGNOSIS — Z3480 Encounter for supervision of other normal pregnancy, unspecified trimester: Secondary | ICD-10-CM

## 2012-09-20 DIAGNOSIS — O2390 Unspecified genitourinary tract infection in pregnancy, unspecified trimester: Secondary | ICD-10-CM

## 2012-09-20 NOTE — Progress Notes (Signed)
[id number] 

## 2012-09-20 NOTE — Patient Instructions (Addendum)
Thank you fro coming in today. You and the baby appear to be doing well.  [facility] and Thursday I will schedule you for induction on Monday morning.  Pregnancy - Third Trimester The third trimester of pregnancy (the last 3 months) is a period of the most rapid growth for you and your baby. The baby approaches a length of 20 inches and a weight of 6 to 10 pounds. The baby is adding on fat and getting ready for life outside your body. While inside, babies have periods of sleeping and waking, suck their thumbs, and hiccups. You can often feel small contractions of the uterus. This is false labor. It is also called Braxton-Hicks contractions. This is like a practice for labor. The usual problems in this stage of pregnancy include more difficulty breathing, swelling of the hands and feet from water retention, and having to urinate more often because of the uterus and baby pressing on your bladder.  PRENATAL EXAMS  Blood work may continue to be done during prenatal exams. These tests are done to check on your health and the probable health of your baby. Blood work is used to follow your blood levels (hemoglobin). Anemia (low hemoglobin) is common during pregnancy. Iron and vitamins are given to help prevent this. You may also continue to be checked for diabetes. Some of the past blood tests may be done again.  The size of the uterus is measured during each visit. This makes sure your baby is growing properly according to your pregnancy dates.  Your blood pressure is checked every prenatal visit. This is to make sure you are not getting toxemia.  Your urine is checked every prenatal visit for infection, diabetes and protein.  Your weight is checked at each visit. This is done to make sure gains are happening at the suggested rate and that you and your baby are growing normally.  Sometimes, an ultrasound is performed to confirm the  position and the proper growth and development of the baby. This is a test done that bounces harmless sound waves off the baby so your caregiver can more accurately determine due dates.  Discuss the type of pain medication and anesthesia you will have during your labor and delivery.  Discuss the possibility and anesthesia if a Cesarean Section might be necessary.  Inform your caregiver if there is any mental or physical violence at home. Sometimes, a specialized non-stress test, contraction stress test and biophysical profile are done to make sure the baby is not having a problem. Checking the amniotic fluid surrounding the baby is called an amniocentesis. The amniotic fluid is removed by sticking a needle into the belly (abdomen). This is sometimes done near the end of pregnancy if an early delivery is required. In this case, it is done to help make sure the baby's lungs are mature enough for the baby to live outside of the womb. If the lungs are not mature and it is unsafe to deliver the baby, an injection of cortisone medication is given to the mother 1 to 2 days before the delivery. This helps the baby's lungs mature and makes it safer to deliver the baby. CHANGES OCCURING IN THE THIRD TRIMESTER OF PREGNANCY Your body goes through many changes during pregnancy. They vary from person to person. Talk to your caregiver about changes you notice and are concerned about.  During the last trimester, you have probably had an increase in  your appetite. It is normal to have cravings for certain foods. This varies from person to person and pregnancy to pregnancy.  You may begin to get stretch marks on your hips, abdomen, and breasts. These are normal changes in the body during pregnancy. There are no exercises or medications to take which prevent this change.  Constipation may be treated with a stool softener or adding bulk to your diet. Drinking lots of fluids, fiber in vegetables, fruits, and whole grains  are helpful.  Exercising is also helpful. If you have been very active up until your pregnancy, most of these activities can be continued during your pregnancy. If you have been less active, it is helpful to start an exercise program such as walking. Consult your caregiver before starting exercise programs.  Avoid all smoking, alcohol, un-prescribed drugs, herbs and "street drugs" during your pregnancy. These chemicals affect the formation and growth of the baby. Avoid chemicals throughout the pregnancy to ensure the delivery of a healthy infant.  Backache, varicose veins and hemorrhoids may develop or get worse.  You will tire more easily in the third trimester, which is normal.  The baby's movements may be stronger and more often.  You may become short of breath easily.  Your belly button may stick out.  A yellow discharge may leak from your breasts called colostrum.  You may have a bloody mucus discharge. This usually occurs a few days to a week before labor begins. HOME CARE INSTRUCTIONS   Keep your caregiver's appointments. Follow your caregiver's instructions regarding medication use, exercise, and diet.  During pregnancy, you are providing food for you and your baby. Continue to eat regular, well-balanced meals. Choose foods such as meat, fish, milk and other low fat dairy products, vegetables, fruits, and whole-grain breads and cereals. Your caregiver will tell you of the ideal weight gain.  A physical sexual relationship may be continued throughout pregnancy if there are no other problems such as early (premature) leaking of amniotic fluid from the membranes, vaginal bleeding, or belly (abdominal) pain.  Exercise regularly if there are no restrictions. Check with your caregiver if you are unsure of the safety of your exercises. Greater weight gain will occur in the last 2 trimesters of pregnancy. Exercising helps:  Control your weight.  Get you in shape for labor and  delivery.  You lose weight after you deliver.  Rest a lot with legs elevated, or as needed for leg cramps or low back pain.  Wear a good support or jogging bra for breast tenderness during pregnancy. This may help if worn during sleep. Pads or tissues may be used in the bra if you are leaking colostrum.  Do not use hot tubs, steam rooms, or saunas.  Wear your seat belt when driving. This protects you and your baby if you are in an accident.  Avoid raw meat, cat litter boxes and soil used by cats. These carry germs that can cause birth defects in the baby.  It is easier to loose urine during pregnancy. Tightening up and strengthening the pelvic muscles will help with this problem. You can practice stopping your urination while you are going to the bathroom. These are the same muscles you need to strengthen. It is also the muscles you would use if you were trying to stop from passing gas. You can practice tightening these muscles up 10 times a set and repeating this about 3 times per day. Once you know what muscles to tighten up, do not  perform these exercises during urination. It is more likely to cause an infection by backing up the urine.  Ask for help if you have financial, counseling or nutritional needs during pregnancy. Your caregiver will be able to offer counseling for these needs as well as refer you for other special needs.  Make a list of emergency phone numbers and have them available.  Plan on getting help from family or friends when you go home from the hospital.  Make a trial run to the hospital.  Take prenatal classes with the father to understand, practice and ask questions about the labor and delivery.  Prepare the baby's room/nursery.  Do not travel out of the city unless it is absolutely necessary and with the advice of your caregiver.  Wear only low or no heal shoes to have better balance and prevent falling. MEDICATIONS AND DRUG USE IN PREGNANCY  Take prenatal  vitamins as directed. The vitamin should contain 1 milligram of folic acid. Keep all vitamins out of reach of children. Only a couple vitamins or tablets containing iron may be fatal to a baby or young child when ingested.  Avoid use of all medications, including herbs, over-the-counter medications, not prescribed or suggested by your caregiver. Only take over-the-counter or prescription medicines for pain, discomfort, or fever as directed by your caregiver. Do not use aspirin, ibuprofen (Motrin, Advil, Nuprin) or naproxen (Aleve) unless OK'd by your caregiver.  Let your caregiver also know about herbs you may be using.  Alcohol is related to a number of birth defects. This includes fetal alcohol syndrome. All alcohol, in any form, should be avoided completely. Smoking will cause low birth rate and premature babies.  Street/illegal drugs are very harmful to the baby. They are absolutely forbidden. A baby born to an addicted mother will be addicted at birth. The baby will go through the same withdrawal an adult does. SEEK MEDICAL CARE IF: You have any concerns or worries during your pregnancy. It is better to call with your questions if you feel they cannot wait, rather than worry about them. DECISIONS ABOUT CIRCUMCISION You may or may not know the sex of your baby. If you know your baby is a boy, it may be time to think about circumcision. Circumcision is the removal of the foreskin of the penis. This is the skin that covers the sensitive end of the penis. There is no proven medical need for this. Often this decision is made on what is popular at the time or based upon religious beliefs and social issues. You can discuss these issues with your caregiver or pediatrician. SEEK IMMEDIATE MEDICAL CARE IF:   An unexplained oral temperature above 102 F (38.9 C) develops, or as your caregiver suggests.  You have leaking of fluid from the vagina (birth canal). If leaking membranes are suspected, take  your temperature and tell your caregiver of this when you call.  There is vaginal spotting, bleeding or passing clots. Tell your caregiver of the amount and how many pads are used.  You develop a bad smelling vaginal discharge with a change in the color from clear to white.  You develop vomiting that lasts more than 24 hours.  You develop chills or fever.  You develop shortness of breath.  You develop burning on urination.  You loose more than 2 pounds of weight or gain more than 2 pounds of weight or as suggested by your caregiver.  You notice sudden swelling of your face, hands, and feet or  legs.  You develop belly (abdominal) pain. Round ligament discomfort is a common non-cancerous (benign) cause of abdominal pain in pregnancy. Your caregiver still must evaluate you.  You develop a severe headache that does not go away.  You develop visual problems, blurred or double vision.  If you have not felt your baby move for more than 1 hour. If you think the baby is not moving as much as usual, eat something with sugar in it and lie down on your left side for an hour. The baby should move at least 4 to 5 times per hour. Call right away if your baby moves less than that.  You fall, are in a car accident or any kind of trauma.  There is mental or physical violence at home. Document Released: 04/29/2001 Document Revised: 07/28/2011 Document Reviewed: 11/01/2008 [facility] Patient Information 2013 [place], [place]. Fetal Movement Counts Patient Name: __________________________________________________ Patient Due Date: ____________________ Melody Haver counts is highly recommended in high risk pregnancies, but it is a good idea for every pregnant woman to do. Start counting fetal movements at 28 weeks of the pregnancy. Fetal movements increase after eating a full meal or eating or drinking something sweet (the blood sugar is higher). It is also important to drink plenty of fluids (well hydrated) before  doing the count. Lie on your left side because it helps with the circulation or you can sit in a comfortable chair with your arms over your belly (abdomen) with no distractions around you. DOING THE COUNT  Try to do the count the same time of day each time you do it.  Mark the day and time, then see how long it takes for you to feel 10 movements (kicks, flutters, swishes, rolls). You should have at least 10 movements within 2 hours. You will most likely feel 10 movements in much less than 2 hours. If you do not, wait an hour and count again. After a couple of days you will see a pattern.  What you are looking for is a change in the pattern or not enough counts in 2 hours. Is it taking longer in time to reach 10 movements? SEEK MEDICAL CARE IF:  You feel less than 10 counts in 2 hours. Tried twice.  No movement in one hour.  The pattern is changing or taking longer each day to reach 10 counts in 2 hours.  You feel the baby is not moving as it usually does. Date: ____________ Movements: ____________ Start time: ____________ Doreatha Martin time: ____________  Date: ____________ Movements: ____________ Start time: ____________ Doreatha Martin time: ____________ Date: ____________ Movements: ____________ Start time: ____________ Doreatha Martin time: ____________ Date: ____________ Movements: ____________ Start time: ____________ Doreatha Martin time: ____________ Date: ____________ Movements: ____________ Start time: ____________ Doreatha Martin time: ____________ Date: ____________ Movements: ____________ Start time: ____________ Doreatha Martin time: ____________ Date: ____________ Movements: ____________ Start time: ____________ Doreatha Martin time: ____________ Date: ____________ Movements: ____________ Start time: ____________ Doreatha Martin time: ____________  Date: ____________ Movements: ____________ Start time: ____________ Doreatha Martin time: ____________ Date: ____________ Movements: ____________ Start time: ____________ Doreatha Martin time: ____________ Date:  ____________ Movements: ____________ Start time: ____________ Doreatha Martin time: ____________ Date: ____________ Movements: ____________ Start time: ____________ Doreatha Martin time: ____________ Date: ____________ Movements: ____________ Start time: ____________ Doreatha Martin time: ____________ Date: ____________ Movements: ____________ Start time: ____________ Doreatha Martin time: ____________ Date: ____________ Movements: ____________ Start time: ____________ Doreatha Martin time: ____________  Date: ____________ Movements: ____________ Start time: ____________ Doreatha Martin time: ____________ Date: ____________ Movements: ____________ Start time: ____________ Doreatha Martin time: ____________ Date: ____________ Movements: ____________ Start time: ____________  Finish time: ____________ Date: ____________ Movements: ____________ Start time: ____________ Doreatha Martin time: ____________ Date: ____________ Movements: ____________ Start time: ____________ Doreatha Martin time: ____________ Date: ____________ Movements: ____________ Start time: ____________ Doreatha Martin time: ____________ Date: ____________ Movements: ____________ Start time: ____________ Doreatha Martin time: ____________  Date: ____________ Movements: ____________ Start time: ____________ Doreatha Martin time: ____________ Date: ____________ Movements: ____________ Start time: ____________ Doreatha Martin time: ____________ Date: ____________ Movements: ____________ Start time: ____________ Doreatha Martin time: ____________ Date: ____________ Movements: ____________ Start time: ____________ Doreatha Martin time: ____________ Date: ____________ Movements: ____________ Start time: ____________ Doreatha Martin time: ____________ Date: ____________ Movements: ____________ Start time: ____________ Doreatha Martin time: ____________ Date: ____________ Movements: ____________ Start time: ____________ Doreatha Martin time: ____________  Date: ____________ Movements: ____________ Start time: ____________ Doreatha Martin time: ____________ Date: ____________ Movements: ____________ Start  time: ____________ Doreatha Martin time: ____________ Date: ____________ Movements: ____________ Start time: ____________ Doreatha Martin time: ____________ Date: ____________ Movements: ____________ Start time: ____________ Doreatha Martin time: ____________ Date: ____________ Movements: ____________ Start time: ____________ Doreatha Martin time: ____________ Date: ____________ Movements: ____________ Start time: ____________ Doreatha Martin time: ____________ Date: ____________ Movements: ____________ Start time: ____________ Doreatha Martin time: ____________  Date: ____________ Movements: ____________ Start time: ____________ Doreatha Martin time: ____________ Date: ____________ Movements: ____________ Start time: ____________ Doreatha Martin time: ____________ Date: ____________ Movements: ____________ Start time: ____________ Doreatha Martin time: ____________ Date: ____________ Movements: ____________ Start time: ____________ Doreatha Martin time: ____________ Date: ____________ Movements: ____________ Start time: ____________ Doreatha Martin time: ____________ Date: ____________ Movements: ____________ Start time: ____________ Doreatha Martin time: ____________ Date: ____________ Movements: ____________ Start time: ____________ Doreatha Martin time: ____________  Date: ____________ Movements: ____________ Start time: ____________ Doreatha Martin time: ____________ Date: ____________ Movements: ____________ Start time: ____________ Doreatha Martin time: ____________ Date: ____________ Movements: ____________ Start time: ____________ Doreatha Martin time: ____________ Date: ____________ Movements: ____________ Start time: ____________ Doreatha Martin time: ____________ Date: ____________ Movements: ____________ Start time: ____________ Doreatha Martin time: ____________ Date: ____________ Movements: ____________ Start time: ____________ Doreatha Martin time: ____________ Date: ____________ Movements: ____________ Start time: ____________ Doreatha Martin time: ____________  Date: ____________ Movements: ____________ Start time: ____________ Doreatha Martin time:  ____________ Date: ____________ Movements: ____________ Start time: ____________ Doreatha Martin time: ____________ Date: ____________ Movements: ____________ Start time: ____________ Doreatha Martin time: ____________ Date: ____________ Movements: ____________ Start time: ____________ Doreatha Martin time: ____________ Date: ____________ Movements: ____________ Start time: ____________ Doreatha Martin time: ____________ Date: ____________ Movements: ____________ Start time: ____________ Doreatha Martin time: ____________ Document Released: 06/04/2006 Document Revised: 07/28/2011 Document Reviewed: 12/05/2008 [employer] Patient Information 2013 [employer].

## 2012-09-20 NOTE — Progress Notes (Signed)
NST reviewed and reactive.  

## 2012-09-20 NOTE — MAU Note (Signed)
Patient sent from [facility] for BPP and NST due to post dates, Patient will go to Antenatal Testing for this testing.

## 2012-09-20 NOTE — Progress Notes (Signed)
Doing well. No concerns/complaints today. Reports regular fetal movement. Continues to take PNV and iron supplementation. Reviewed [place] w/ pt and discussed dating further. It appears that pt had regular periods on the 18th of May - July but her period in Aug came several days late and lasted much shorter than nml. Previous infants born at term and all 7lb babys except one that was 8lb.  Denies dysuria, vaginal discharge/bleeding, RUQ pain, HA.   A/P 21yo [id number] at 40.2 based on 3rd trimester [place] complicated by anemia and Late PNC. - Dating changed today from 37.1 to 40.2 based on further information regarding unreliable LMP and measuring dates < size adn [place] report.  - GBS, GC/Chl today - Will send pt to [facility] today for NST given HR of 120s and post dates. Also scheduled for Thu. - induction scheduled for Monday - labor precautions and kick counts reviewed.

## 2012-09-21 ENCOUNTER — Telehealth (HOSPITAL_COMMUNITY): Payer: Self-pay | Admitting: *Deleted

## 2012-09-21 NOTE — Telephone Encounter (Signed)
Preadmission screen  

## 2012-09-22 LAB — STREP B DNA PROBE: GBSP: NEGATIVE

## 2012-09-23 ENCOUNTER — Other Ambulatory Visit: Payer: Self-pay

## 2012-09-23 ENCOUNTER — Ambulatory Visit (INDEPENDENT_AMBULATORY_CARE_PROVIDER_SITE_OTHER): Payer: Self-pay | Admitting: *Deleted

## 2012-09-23 ENCOUNTER — Telehealth: Payer: Self-pay | Admitting: Family Medicine

## 2012-09-23 VITALS — BP 115/62

## 2012-09-23 DIAGNOSIS — O2343 Unspecified infection of urinary tract in pregnancy, third trimester: Secondary | ICD-10-CM

## 2012-09-23 DIAGNOSIS — O480 Post-term pregnancy: Secondary | ICD-10-CM

## 2012-09-23 MED ORDER — CEPHALEXIN 500 MG PO CAPS
500.0000 mg | ORAL_CAPSULE | Freq: Two times a day (BID) | ORAL | Status: DC
Start: 2012-09-23 — End: 2012-09-27

## 2012-09-23 NOTE — Telephone Encounter (Signed)
Attempted to call but no answer. See problem list  Shelly Flatten, MD Family Medicine PGY-2 09/23/2012, 12:18 PM

## 2012-09-23 NOTE — Progress Notes (Signed)
NST reviewed and reactive.  Jeury Mcnab L. Harraway-Smith, M.D., FACOG    

## 2012-09-23 NOTE — Progress Notes (Signed)
P = 94   Pt is scheduled for IOL on 5/12 @ 0700

## 2012-09-23 NOTE — Telephone Encounter (Signed)
Yes. Sorry for the vague message

## 2012-09-23 NOTE — Assessment & Plan Note (Signed)
UTI on UA Keflex

## 2012-09-23 NOTE — Telephone Encounter (Signed)
Ok just to clarify.  This pt has a UTI and sent in keflex for her correct? Lindsey Paul, Lindsey Paul

## 2012-09-24 NOTE — Telephone Encounter (Signed)
Is concerned because this is the same med as last time and wants to speak with MD . Milas Gain, Maryjo Rochester

## 2012-09-24 NOTE — Telephone Encounter (Signed)
Spoke with pt.  She questions if this is the same medication she took in April.  After reviewing chart it is.  Pt has questions that I am unable to answer, advised I would send message back to MD.

## 2012-09-26 MED ORDER — CEPHALEXIN 500 MG PO CAPS
500.0000 mg | ORAL_CAPSULE | Freq: Two times a day (BID) | ORAL | Status: DC
  Administered 2012-09-27: 500 mg via ORAL
  Filled 2012-09-26 (×3): qty 1

## 2012-09-26 MED ORDER — FERROUS SULFATE 325 (65 FE) MG PO TABS
325.0000 mg | ORAL_TABLET | Freq: Two times a day (BID) | ORAL | Status: DC
  Administered 2012-09-27: 325 mg via ORAL
  Filled 2012-09-26 (×2): qty 1

## 2012-09-26 NOTE — H&P (Signed)
Lindsey Paul is a 22 y.o. female presenting for IOL for post dates. History  22 yo [id number] at 41.2 based on 37 wk [place] who was late to [facility]. Pt US performed at 34.1 per pt LMP due to measuring size greater than dates showed an estimated gestational age of 46.1. Pt dating orignally based on LMP was unreliable after further information given by pt regarding irregular period in August. Presented for [facility] at 28wks. Previous pregnancies were uncomplicated w/o GDM, PIH, w/ all others being SVD. Previous deliveries were 7lb babies and one 8lb baby.  3hr gtt of 115.    OB History   Grav Para Term Preterm Abortions TAB SAB Ect Mult Living   6 4 4  0 1 1 0 0 0 4     Past Medical History  Diagnosis Date  . Medical history non-contributory    Past Surgical History  Procedure Laterality Date  . Dilation and curettage of uterus     Family History: family history is negative for Diabetes, and Cancer, and Hypertension, and Hyperlipidemia, . Social History:  reports that she has never smoked. She does not have any smokeless tobacco history on file. She reports that she does not drink alcohol or use illicit drugs.  ROS    Last menstrual period 01/04/2012. Exam Physical Exam  See last office note.  Interim H&P to be performed by admitting Team on 09/27/12  Prenatal labs: ABO, Rh: A/POS/-- (02/07 1054) Antibody: NEG (02/07 1054) Rubella: 1.69 (02/07 1054) RPR: NON REAC (02/07 1054)  HBsAg: NEGATIVE (02/07 1054)  HIV: NON REACTIVE (02/07 1054)  GBS: NEGATIVE (05/05 1217)   Assessment/Plan: 22 yo [id number] at 41.2 based on 37 wk [place] who was late to [facility].  - Anticipate SVD - Pitocin - Epidural if desired   Tymeir Weathington, MD Family Medicine Resident PGY-2 09/26/2012, 5:16 PM

## 2012-09-27 ENCOUNTER — Encounter (HOSPITAL_COMMUNITY): Payer: Self-pay | Admitting: Anesthesiology

## 2012-09-27 ENCOUNTER — Encounter (HOSPITAL_COMMUNITY): Payer: Self-pay

## 2012-09-27 ENCOUNTER — Inpatient Hospital Stay (HOSPITAL_COMMUNITY)
Admission: RE | Admit: 2012-09-27 | Discharge: 2012-09-29 | DRG: 775 | Disposition: A | Discharge status: Home / Self Care | Payer: Medicaid Other | Source: Ambulatory Visit | Attending: Family Medicine | Admitting: Family Medicine

## 2012-09-27 ENCOUNTER — Inpatient Hospital Stay (HOSPITAL_COMMUNITY): Payer: Medicaid Other | Admitting: Anesthesiology

## 2012-09-27 VITALS — BP 116/67 | HR 71 | Temp 98.1°F | Resp 18 | Ht 63.0 in | Wt 252.0 lb

## 2012-09-27 DIAGNOSIS — Z8759 Personal history of other complications of pregnancy, childbirth and the puerperium: Secondary | ICD-10-CM

## 2012-09-27 DIAGNOSIS — O4100X0 Oligohydramnios, unspecified trimester, not applicable or unspecified: Secondary | ICD-10-CM | POA: Diagnosis present

## 2012-09-27 DIAGNOSIS — O480 Post-term pregnancy: Principal | ICD-10-CM | POA: Diagnosis present

## 2012-09-27 DIAGNOSIS — Z86718 Personal history of other venous thrombosis and embolism: Secondary | ICD-10-CM

## 2012-09-27 HISTORY — DX: Anemia, unspecified: D64.9

## 2012-09-27 LAB — CBC
HCT: 34 % — ABNORMAL LOW (ref 36.0–46.0)
HCT: 33.2 % — ABNORMAL LOW (ref 36.0–46.0)
Hemoglobin: 10.9 g/dL — ABNORMAL LOW (ref 12.0–15.0)
Hemoglobin: 10.7 g/dL — ABNORMAL LOW (ref 12.0–15.0)
MCH: 25.5 pg — ABNORMAL LOW (ref 26.0–34.0)
MCHC: 32.1 g/dL (ref 30.0–36.0)
MCV: 79.8 fL (ref 78.0–100.0)
MCV: 79 fL (ref 78.0–100.0)
RBC: 4.2 MIL/uL (ref 3.87–5.11)
RDW: 20.8 % — ABNORMAL HIGH (ref 11.5–15.5)
WBC: 12.1 10*3/uL — ABNORMAL HIGH (ref 4.0–10.5)

## 2012-09-27 MED ORDER — PRENATAL MULTIVITAMIN CH
1.0000 | ORAL_TABLET | Freq: Every day | ORAL | Status: DC
  Administered 2012-09-28 – 2012-09-29 (×2): 1 via ORAL
  Filled 2012-09-27 (×2): qty 1

## 2012-09-27 MED ORDER — ONDANSETRON HCL 4 MG/2ML IJ SOLN: 4.0000 mg | INTRAMUSCULAR | Status: DC | PRN

## 2012-09-27 MED ORDER — OXYCODONE-ACETAMINOPHEN 5-325 MG PO TABS
1.0000 | ORAL_TABLET | ORAL | Status: DC | PRN
  Administered 2012-09-28 (×2): 1 via ORAL
  Administered 2012-09-28 (×2): 2 via ORAL
  Administered 2012-09-28 (×2): 1 via ORAL
  Administered 2012-09-29: 2 via ORAL
  Administered 2012-09-29: 1 via ORAL
  Filled 2012-09-27 (×2): qty 1
  Filled 2012-09-27: qty 2
  Filled 2012-09-27: qty 1
  Filled 2012-09-27: qty 2
  Filled 2012-09-27: qty 1
  Filled 2012-09-27: qty 2
  Filled 2012-09-27: qty 1

## 2012-09-27 MED ORDER — SENNOSIDES-DOCUSATE SODIUM 8.6-50 MG PO TABS
2.0000 | ORAL_TABLET | Freq: Every day | ORAL | Status: DC
  Administered 2012-09-28: 2 via ORAL

## 2012-09-27 MED ORDER — CITRIC ACID-SODIUM CITRATE 334-500 MG/5ML PO SOLN: 30.0000 mL | ORAL | Status: DC | PRN

## 2012-09-27 MED ORDER — LACTATED RINGERS IV SOLN
500.0000 mL | Freq: Once | INTRAVENOUS | Status: AC
  Administered 2012-09-27: 1000 mL via INTRAVENOUS

## 2012-09-27 MED ORDER — BENZOCAINE-MENTHOL 20-0.5 % EX AERO: 1.0000 "application " | INHALATION_SPRAY | CUTANEOUS | Status: DC | PRN

## 2012-09-27 MED ORDER — PHENYLEPHRINE 40 MCG/ML (10ML) SYRINGE FOR IV PUSH (FOR BLOOD PRESSURE SUPPORT)
80.0000 ug | PREFILLED_SYRINGE | INTRAVENOUS | Status: DC | PRN
  Filled 2012-09-27: qty 2
  Filled 2012-09-27: qty 5

## 2012-09-27 MED ORDER — IBUPROFEN 600 MG PO TABS
600.0000 mg | ORAL_TABLET | Freq: Four times a day (QID) | ORAL | Status: DC
  Administered 2012-09-28 – 2012-09-29 (×7): 600 mg via ORAL
  Filled 2012-09-27 (×7): qty 1

## 2012-09-27 MED ORDER — OXYTOCIN 40 UNITS IN LACTATED RINGERS INFUSION - SIMPLE MED: 62.5000 mL/h | INTRAVENOUS | Status: DC

## 2012-09-27 MED ORDER — SIMETHICONE 80 MG PO CHEW: 80.0000 mg | CHEWABLE_TABLET | ORAL | Status: DC | PRN

## 2012-09-27 MED ORDER — LACTATED RINGERS IV SOLN: 500.0000 mL | INTRAVENOUS | Status: DC | PRN

## 2012-09-27 MED ORDER — MISOPROSTOL 200 MCG PO TABS: 800.0000 ug | ORAL_TABLET | Freq: Once | ORAL | Status: AC

## 2012-09-27 MED ORDER — ZOLPIDEM TARTRATE 5 MG PO TABS: 5.0000 mg | ORAL_TABLET | Freq: Every evening | ORAL | Status: DC | PRN

## 2012-09-27 MED ORDER — FENTANYL 2.5 MCG/ML BUPIVACAINE 1/10 % EPIDURAL INFUSION (WH - ANES)
INTRAMUSCULAR | Status: DC | PRN
  Administered 2012-09-27: 14 mL/h via EPIDURAL

## 2012-09-27 MED ORDER — DIPHENHYDRAMINE HCL 50 MG/ML IJ SOLN: 12.5000 mg | INTRAMUSCULAR | Status: DC | PRN

## 2012-09-27 MED ORDER — TERBUTALINE SULFATE 1 MG/ML IJ SOLN: 0.2500 mg | Freq: Once | INTRAMUSCULAR | Status: DC | PRN

## 2012-09-27 MED ORDER — MISOPROSTOL 200 MCG PO TABS
ORAL_TABLET | ORAL | Status: AC
  Administered 2012-09-27: 800 ug via RECTAL
  Filled 2012-09-27: qty 4

## 2012-09-27 MED ORDER — LANOLIN HYDROUS EX OINT: TOPICAL_OINTMENT | CUTANEOUS | Status: DC | PRN

## 2012-09-27 MED ORDER — EPHEDRINE 5 MG/ML INJ
10.0000 mg | INTRAVENOUS | Status: DC | PRN
  Filled 2012-09-27: qty 4
  Filled 2012-09-27: qty 2

## 2012-09-27 MED ORDER — OXYTOCIN 40 UNITS IN LACTATED RINGERS INFUSION - SIMPLE MED
1.0000 m[IU]/min | INTRAVENOUS | Status: DC
  Administered 2012-09-27: 2 m[IU]/min via INTRAVENOUS
  Filled 2012-09-27: qty 1000

## 2012-09-27 MED ORDER — LACTATED RINGERS IV SOLN
INTRAVENOUS | Status: DC
  Administered 2012-09-27 (×2): via INTRAVENOUS
  Administered 2012-09-27: 125 mL/h via INTRAVENOUS

## 2012-09-27 MED ORDER — IBUPROFEN 600 MG PO TABS
600.0000 mg | ORAL_TABLET | Freq: Four times a day (QID) | ORAL | Status: DC | PRN
  Administered 2012-09-27: 600 mg via ORAL
  Filled 2012-09-27: qty 1

## 2012-09-27 MED ORDER — LIDOCAINE HCL (PF) 1 % IJ SOLN
INTRAMUSCULAR | Status: DC | PRN
  Administered 2012-09-27 (×2): 8 mL

## 2012-09-27 MED ORDER — EPHEDRINE 5 MG/ML INJ
10.0000 mg | INTRAVENOUS | Status: DC | PRN
  Filled 2012-09-27: qty 2

## 2012-09-27 MED ORDER — TETANUS-DIPHTH-ACELL PERTUSSIS 5-2.5-18.5 LF-MCG/0.5 IM SUSP: 0.5000 mL | Freq: Once | INTRAMUSCULAR | Status: DC

## 2012-09-27 MED ORDER — FENTANYL 2.5 MCG/ML BUPIVACAINE 1/10 % EPIDURAL INFUSION (WH - ANES)
14.0000 mL/h | INTRAMUSCULAR | Status: DC | PRN
  Administered 2012-09-27: 14 mL/h via EPIDURAL
  Filled 2012-09-27 (×2): qty 125

## 2012-09-27 MED ORDER — OXYTOCIN BOLUS FROM INFUSION
500.0000 mL | INTRAVENOUS | Status: DC
  Administered 2012-09-27: 500 mL via INTRAVENOUS

## 2012-09-27 MED ORDER — ONDANSETRON HCL 4 MG/2ML IJ SOLN: 4.0000 mg | Freq: Four times a day (QID) | INTRAMUSCULAR | Status: DC | PRN

## 2012-09-27 MED ORDER — DIBUCAINE 1 % RE OINT: 1.0000 "application " | TOPICAL_OINTMENT | RECTAL | Status: DC | PRN

## 2012-09-27 MED ORDER — FLEET ENEMA 7-19 GM/118ML RE ENEM: 1.0000 | ENEMA | RECTAL | Status: DC | PRN

## 2012-09-27 MED ORDER — OXYCODONE-ACETAMINOPHEN 5-325 MG PO TABS: 1.0000 | ORAL_TABLET | ORAL | Status: DC | PRN

## 2012-09-27 MED ORDER — LIDOCAINE HCL (PF) 1 % IJ SOLN
30.0000 mL | INTRAMUSCULAR | Status: DC | PRN
  Filled 2012-09-27 (×2): qty 30

## 2012-09-27 MED ORDER — ONDANSETRON HCL 4 MG PO TABS: 4.0000 mg | ORAL_TABLET | ORAL | Status: DC | PRN

## 2012-09-27 MED ORDER — PHENYLEPHRINE 40 MCG/ML (10ML) SYRINGE FOR IV PUSH (FOR BLOOD PRESSURE SUPPORT)
80.0000 ug | PREFILLED_SYRINGE | INTRAVENOUS | Status: DC | PRN
  Filled 2012-09-27: qty 2

## 2012-09-27 MED ORDER — ACETAMINOPHEN 325 MG PO TABS: 650.0000 mg | ORAL_TABLET | ORAL | Status: DC | PRN

## 2012-09-27 MED ORDER — DIPHENHYDRAMINE HCL 25 MG PO CAPS: 25.0000 mg | ORAL_CAPSULE | Freq: Four times a day (QID) | ORAL | Status: DC | PRN

## 2012-09-27 MED ORDER — WITCH HAZEL-GLYCERIN EX PADS: 1.0000 "application " | MEDICATED_PAD | CUTANEOUS | Status: DC | PRN

## 2012-09-27 NOTE — Progress Notes (Signed)
Lindsey Paul is a 22 y.o. [id number] at [redacted]w[redacted]d by ultrasound admitted for induction of labor due to Post dates..  Subjective: Feeling some pressure, generally comfortable  Objective: BP 128/83  Pulse 83  Temp(Src) 98.3 F (36.8 C) (Oral)  Resp 20  Ht 5\' 3"  (1.6 m)  Wt 252 lb (114.306 kg)  BMI 44.65 kg/m2  SpO2 100%  LMP 01/04/2012   Total I/O In: -  Out: 200 [Urine:200]  FHT:  FHR: 130 bpm, variability: moderate,  accelerations:  Abscent,  decelerations:  Absent UC:   regular, every 2-3 minutes SVE:   Dilation: 8 Effacement (%): 100 Station: -2;-1 Exam by:: S Grindstaff RN  Labs: Lab Results  Component Value Date   WBC 9.3 09/27/2012   HGB 10.9* 09/27/2012   HCT 34.0* 09/27/2012   MCV 79.8 09/27/2012   PLT 135* 09/27/2012    Assessment / Plan: Induction of labor due to postterm,  progressing well on pitocin  Labor: Progressing normally Preeclampsia:  n/a Fetal Wellbeing:  Category I Pain Control:  Epidural I/D:  n/a Anticipated MOD:  NSVD  Lindsey Paul 09/27/2012, 6:10 PM

## 2012-09-27 NOTE — Progress Notes (Addendum)
Lindsey Paul is a 22 y.o. [id number] at [redacted]w[redacted]d by ultrasound admitted for induction of labor due to Post dates and Oligo.  Subjective: Doing well, comfortable with epidural  Objective: BP 128/83  Pulse 83  Temp(Src) 98.3 F (36.8 C) (Oral)  Resp 20  Ht 5\' 3"  (1.6 m)  Wt 252 lb (114.306 kg)  BMI 44.65 kg/m2  SpO2 100%  LMP 01/04/2012   Total I/O In: -  Out: 200 [Urine:200]  FHT:  Baseline 135, + accels, no decels, good variability UC:   regular, every 3 minutes SVE:   Dilation: 3 (cervix at pt's far left.) Effacement (%): 60 Station: -3 Exam by:: Enis Slipper, RN  Labs: Lab Results  Component Value Date   WBC 9.3 09/27/2012   HGB 10.9* 09/27/2012   HCT 34.0* 09/27/2012   MCV 79.8 09/27/2012   PLT 135* 09/27/2012    Assessment / Plan: Induction of labor due to postterm,  progressing well on pitocin  Labor: Progressing normally Preeclampsia:  n/a Fetal Wellbeing:  Category I Pain Control:  Epidural I/D:  n/a Anticipated MOD:  NSVD  WILLIAMS,MARIE 09/27/2012, 6:06 PM

## 2012-09-27 NOTE — H&P (Signed)
Attestation of Attending Supervision of Resident: Evaluation and management procedures were performed by the [facility] Resident under my supervision.  I have seen and examined the patient, reviewed the resident's note and chart, and I agree with the management and plan for IOL.  Patient has a history of left ovarian vein thrombosis postpartum in 2007 and has required postpartum anticoagulation x 6 weeks since then.  The plan is to initiate this postpartum for her during this hospitalization.    Jaynie Collins, MD, FACOG Attending Obstetrician & Gynecologist Faculty Practice, [facility] [place]

## 2012-09-27 NOTE — Progress Notes (Signed)
Lindsey Paul is a 22 y.o. [id number] at [redacted]w[redacted]d by  ultrasound admitted for induction of labor due to Post dates.  Subjective: Doing well. Pain well controlled w/ epidural. Feeling contractions. No urge to push  Objective: BP 117/73  Pulse 85  Temp(Src) 98.3 F (36.8 C) (Oral)  Resp 18  Ht 5\' 3"  (1.6 m)  Wt 252 lb (114.306 kg)  BMI 44.65 kg/m2  SpO2 100%  LMP 01/04/2012 I/O last 3 completed shifts: In: -  Out: 200 [Urine:200]    FHT:  FHR: 130s bpm, variability: moderate,  accelerations:  Present,  decelerations:  Absent UC:   regular, every 2 minutes SVE:   Dilation: complete Effacement (%): 100 Station: 0 Exam by:: Shelly Flatten, MD  Labs: Lab Results  Component Value Date   WBC 9.3 09/27/2012   HGB 10.9* 09/27/2012   HCT 34.0* 09/27/2012   MCV 79.8 09/27/2012   PLT 135* 09/27/2012    Assessment / Plan: Induction of labor due to postterm,  progressing well on pitocin  Labor: Progressing normally and AROM Fetal Wellbeing:  Category I Pain Control:  Epidural I/D:  n/a Anticipated MOD:  NSVD   Shelly Flatten, MD Family Medicine PGY-2 09/27/2012, 8:20 PM

## 2012-09-27 NOTE — Anesthesia Preprocedure Evaluation (Signed)
Anesthesia Evaluation  Patient identified by MRN, date of birth, ID band Patient awake    Reviewed: Allergy & Precautions, H&P , NPO status , Patient's Chart, lab work & pertinent test results  Airway Mallampati: II TM Distance: >3 FB Neck ROM: full    Dental no notable dental hx.    Pulmonary neg pulmonary ROS,  breath sounds clear to auscultation  Pulmonary exam normal       Cardiovascular negative cardio ROS      Neuro/Psych negative neurological ROS  negative psych ROS   GI/Hepatic negative GI ROS, Neg liver ROS,   Endo/Other  Morbid obesity  Renal/GU negative Renal ROS  negative genitourinary   Musculoskeletal negative musculoskeletal ROS (+)   Abdominal (+) + obese,   Peds negative pediatric ROS (+)  Hematology negative hematology ROS (+)   Anesthesia Other Findings   Reproductive/Obstetrics (+) Pregnancy                           Anesthesia Physical Anesthesia Plan  ASA: III  Anesthesia Plan: Epidural   Post-op Pain Management:    Induction:   Airway Management Planned:   Additional Equipment:   Intra-op Plan:   Post-operative Plan:   Informed Consent: I have reviewed the patients History and Physical, chart, labs and discussed the procedure including the risks, benefits and alternatives for the proposed anesthesia with the patient or authorized representative who has indicated his/her understanding and acceptance.     Plan Discussed with:   Anesthesia Plan Comments:         Anesthesia Quick Evaluation

## 2012-09-27 NOTE — Progress Notes (Signed)
Now has epidural.  Fetal heart rate reactive, category I  UCs every 2 minutes  Cervix deferred.  Dr Margot Ables will be following.   Anticipate SVD

## 2012-09-27 NOTE — Progress Notes (Signed)
Lindsey Paul is a 22 y.o. [id number] at [redacted]w[redacted]d by ultrasound admitted for induction of labor due to Post dates and Oligohydramnios. Has been followed in [facility] clinic by Dr Margot Ables. History is remarkable for PP DVT in 2007  Subjective: Doing well. Not hurting yet, may want epidural later  Objective: BP 130/83  Pulse 92  Temp(Src) 98.4 F (36.9 C) (Oral)  Resp 20  Ht 5\' 3"  (1.6 m)  Wt 252 lb (114.306 kg)  BMI 44.65 kg/m2  LMP 01/04/2012      FHT:  FHR: 145 bpm, variability: moderate,  accelerations:  Present,  decelerations:  Absent UC:   regular, every 5 minutes SVE:   Dilation: 2.5 Station: -3 Exam by:: Dr. Shawnie Pons  Labs: Lab Results  Component Value Date   WBC 9.3 09/27/2012   HGB 10.9* 09/27/2012   HCT 34.0* 09/27/2012   MCV 79.8 09/27/2012   PLT 135* 09/27/2012    Assessment / Plan: Induction of labor due to postterm,  progressing well on pitocin  Labor: Progressing on Pitocin, will continue to increase then AROM Preeclampsia:  n/a Fetal Wellbeing:  Category I Pain Control:  Labor support without medications I/D:  n/a Anticipated MOD:  NSVD  [facility] 09/27/2012, 9:27 AM

## 2012-09-28 LAB — CBC
HCT: 30.5 % — ABNORMAL LOW (ref 36.0–46.0)
Hemoglobin: 9.8 g/dL — ABNORMAL LOW (ref 12.0–15.0)
MCHC: 32.1 g/dL (ref 30.0–36.0)
MCV: 80.1 fL (ref 78.0–100.0)
RDW: 20.9 % — ABNORMAL HIGH (ref 11.5–15.5)

## 2012-09-28 LAB — PROTIME-INR
INR: 0.91 (ref 0.00–1.49)
Prothrombin Time: 12.2 seconds (ref 11.6–15.2)

## 2012-09-28 MED ORDER — ENOXAPARIN SODIUM 40 MG/0.4ML ~~LOC~~ SOLN
40.0000 mg | SUBCUTANEOUS | Status: DC
  Administered 2012-09-28 – 2012-09-29 (×2): 40 mg via SUBCUTANEOUS
  Filled 2012-09-28 (×3): qty 0.4

## 2012-09-28 MED ORDER — WARFARIN - PHYSICIAN DOSING INPATIENT: Freq: Every day | Status: DC

## 2012-09-28 MED ORDER — WARFARIN SODIUM 7.5 MG PO TABS
7.5000 mg | ORAL_TABLET | Freq: Every day | ORAL | Status: DC
  Administered 2012-09-28 – 2012-09-29 (×2): 7.5 mg via ORAL
  Filled 2012-09-28 (×3): qty 1

## 2012-09-28 NOTE — Anesthesia Postprocedure Evaluation (Signed)
Anesthesia Post Note  Patient: Lindsey Paul  Procedure(s) Performed: * No procedures listed *  Anesthesia type: Epidural  Patient location: Mother/Baby  Post pain: Pain level controlled  Post assessment: Post-op Vital signs reviewed  Last Vitals:  Filed Vitals:   09/28/12 0500  BP: 102/66  Pulse: 72  Temp: 36.5 C  Resp: 18    Post vital signs: Reviewed  Level of consciousness: awake  Complications: No apparent anesthesia complications

## 2012-09-28 NOTE — Progress Notes (Signed)
I saw and examined patient and agree with above resident note. I reviewed history, delivery summary, labs and vitals. Ocie Tino, MD  

## 2012-09-28 NOTE — Progress Notes (Signed)
Mom request pacifier for newborn. Explained risks of pacifier use and breastfeeding. Mom still requested pacifier.

## 2012-09-28 NOTE — Progress Notes (Signed)
UR completed 

## 2012-09-28 NOTE — Progress Notes (Signed)
Post Partum Day 1 Subjective: no complaints, up ad lib, voiding, tolerating PO and + flatus  Objective: Blood pressure 102/66, pulse 72, temperature 97.7 F (36.5 C), temperature source Oral, resp. rate 18, height 5\' 3"  (1.6 m), weight 114.306 kg (252 lb), last menstrual period 01/04/2012, SpO2 100.00%, unknown if currently breastfeeding.  Physical Exam:  General: alert, cooperative and appears stated age 22: appropriate Uterine Fundus: firm DVT Evaluation: No evidence of DVT seen on physical exam.   Recent Labs  09/27/12 2150 09/28/12 0621  HGB 10.7* 9.8*  HCT 33.2* 30.5*    Assessment/Plan: Plan for discharge tomorrow, Breastfeeding, Lactation consult, Social Work consult and Contraception nexplanon Plan to start anticoagulation at 24 hours postpartum w/ lovenox bridge and coumadin due to h/o of postpartum L ovarian vein thrombus in 2007 Pt will need medical assistance upon discharge until INR is therapeutic.    LOS: 1 day   Shelly Flatten, MD Family Medicine PGY-2 09/28/2012, 5:22 PM

## 2012-09-29 LAB — CBC
Hemoglobin: 9 g/dL — ABNORMAL LOW (ref 12.0–15.0)
MCH: 25.8 pg — ABNORMAL LOW (ref 26.0–34.0)
MCHC: 32.1 g/dL (ref 30.0–36.0)
Platelets: 114 10*3/uL — ABNORMAL LOW (ref 150–400)
RDW: 20.8 % — ABNORMAL HIGH (ref 11.5–15.5)

## 2012-09-29 LAB — PROTIME-INR
INR: 0.9 (ref 0.00–1.49)
Prothrombin Time: 12.1 seconds (ref 11.6–15.2)

## 2012-09-29 MED ORDER — ENOXAPARIN SODIUM 40 MG/0.4ML ~~LOC~~ SOLN: 40.0000 mg | SUBCUTANEOUS | Status: DC

## 2012-09-29 MED ORDER — IBUPROFEN 600 MG PO TABS: 600.0000 mg | ORAL_TABLET | Freq: Four times a day (QID) | ORAL | Status: DC

## 2012-09-29 MED ORDER — WARFARIN SODIUM 5 MG PO TABS: 7.5000 mg | ORAL_TABLET | Freq: Every day | ORAL | Status: DC

## 2012-09-29 NOTE — Discharge Summary (Signed)
Obstetric Discharge Summary Reason for Admission: induction of labor, for Post dates Prenatal Procedures: ultrasound Intrapartum Procedures: spontaneous vaginal delivery Postpartum Procedures: none Complications-Operative and Postpartum: h/o postpartum ovarian venous thrombus   Delivery Note At 8:59 PM a viable female was delivered via Vaginal, Spontaneous Delivery (Presentation: ; Occiput Anterior).  APGAR: 8, 9; weight 10 lb 5.8 oz (4700 g).   Placenta status: Intact, Spontaneous.  Cord: 3 vessels with the following complications: None.   Anesthesia: Epidural  Episiotomy: None Lacerations: None Est. Blood Loss (mL): 350  Mom to postpartum.  Baby to nursery-stable.  MERRELL, DAVID, MD Family medicine Resident PGY-2 09/29/2012, 5:50 PM     H/H: Lab Results  Component Value Date/Time   HGB 9.0* 09/29/2012  6:10 AM   HCT 28.0* 09/29/2012  6:10 AM   Physical Exam:  General: alert, cooperative and appears stated age  Lochia: appropriate  Uterine Fundus: firm  DVT Evaluation: No evidence of DVT seen on physical exam.    Discharge Diagnoses: Post-date pregnancy  Discharge Information: Date: 11/28/2010 Activity: unrestricted and pelvic rest Diet: routine Medications: Ibuprofen, Lovenox, Warfarin Breast feeding:  Yes Condition: stable Instructions: refer to practice specific booklet Discharge to: home  Pt given paper scripts for lovenox and warfarin. Case mgt assisted in obtaining these [facility]. Pt given careful instructions for medication use and for need for INR check on Friday in the [facility] family medicine clinic.   MERRELL, DAVID, MD Family Medicine Resident 09/29/2012,5:50 PM

## 2012-09-29 NOTE — Care Management Note (Signed)
    Page 1 of 1   09/29/2012     1:54:38 PM   CARE MANAGEMENT NOTE 09/29/2012  Patient:  Lindsey Paul, Lindsey Paul   Account Number:  [id number]  Date Initiated:  09/29/2012  Documentation initiated by:  CRAFT,TERRI  Subjective/Objective Assessment:   22 year old female admitted 09/27/12 for induction.     Action/Plan:   D/C when medically stable.   Anticipated DC Date:  10/02/2012   Anticipated DC Plan:  HOME/SELF CARE      DC Planning Services  CM consult  MATCH Program  Medication Assistance              Status of service:  Completed, signed off   Discharge Disposition:  HOME/SELF CARE  Per UR Regulation:  Reviewed for med. necessity/level of care/duration of stay  Comments:  09/29/12, Kathi Der RNC-MNN, BSN, [phone], CM received referral for medication assistance.  CM spoke with pt and MD.  [facility] program initiated for pt.  Pt given MATCH letter withi instructions, all questions answered.

## 2012-09-29 NOTE — Clinical Social Work Maternal (Signed)
    Clinical Social Work Department PSYCHOSOCIAL ASSESSMENT - MATERNAL/CHILD 09/29/2012  Patient:  Lindsey Paul, Lindsey Paul  Account Number:  [id number]  Admit Date:  09/27/2012  Lindsey Paul Name:   Lindsey Paul    Clinical Social Worker:  Lindsey Putnam, LCSW   Date/Time:  09/29/2012 12:38 PM  Date Referred:  09/29/2012   Referral source  CN     Referred reason  [facility]   Other referral source:    I:  FAMILY / HOME ENVIRONMENT Child's legal guardian:  PARENT  Guardian - Name Guardian - Age Guardian - Address  Lindsey Paul [address] [place], [place] [address]  Lindsey Paul 21 (same as above)   Other household support members/support persons Name Relationship DOB   DAUGHTER 33 years old   DAUGHTER 76 years old   SON 82 years old   DAUGHTER 32 years old   Other support:    II  PSYCHOSOCIAL DATA Information Source:  Patient Interview  Event organiser Employment:   Surveyor, quantity resources:  [employer] If Medicaid - County:  [place] Other  Sales executive  WIC  Work [place] / Grade:   Maternity Care Coordinator / Child Services Coordination / Early Interventions:  Cultural issues impacting care:    III  STRENGTHS Strengths  Adequate Resources  Home prepared for Child (including basic supplies)  Supportive family/friends   Strength comment:    IV  RISK FACTORS AND CURRENT PROBLEMS Current Problem:  YES   Risk Factor & Current Problem Patient Issue Family Issue Risk Factor / Current Problem Comment  Other - See comment Y N LPNC @ 28 weeks    V  SOCIAL WORK ASSESSMENT CSW met with pt to assess reason for [facility] @ 28 weeks.  Pt told CSW that she [facility] to [facility] but could not since she did not have necessary paper work.  When she learned about the [facility] program, she started [facility] & attended appointments regularly. She denies any illegal substance use & verbalized understanding of hospital drug testing policy.  UDS is  negative, meconium results are pending.  She has all the necessary supplies for the infant & appears to be bonding well.  CSW will monitor drug screen results & make a referral if needed.      VI SOCIAL WORK PLAN Social Work Plan  No Further Intervention Required / No Barriers to Discharge   Type of pt/family education:   If child protective services report - county:   If child protective services report - date:   Information/referral to community resources comment:   Other social work plan:

## 2012-09-30 ENCOUNTER — Telehealth: Payer: Self-pay | Admitting: Family Medicine

## 2012-09-30 ENCOUNTER — Encounter (HOSPITAL_COMMUNITY): Payer: Self-pay | Admitting: *Deleted

## 2012-09-30 ENCOUNTER — Inpatient Hospital Stay (HOSPITAL_COMMUNITY)
Admission: AD | Admit: 2012-09-30 | Discharge: 2012-09-30 | Disposition: A | Discharge status: Home / Self Care | Payer: Self-pay | Source: Ambulatory Visit | Attending: Obstetrics and Gynecology | Admitting: Obstetrics and Gynecology

## 2012-09-30 DIAGNOSIS — O864 Pyrexia of unknown origin following delivery: Secondary | ICD-10-CM | POA: Insufficient documentation

## 2012-09-30 DIAGNOSIS — R52 Pain, unspecified: Secondary | ICD-10-CM | POA: Insufficient documentation

## 2012-09-30 DIAGNOSIS — IMO0002 Reserved for concepts with insufficient information to code with codable children: Secondary | ICD-10-CM | POA: Insufficient documentation

## 2012-09-30 HISTORY — DX: Acute embolism and thrombosis of unspecified deep veins of unspecified lower extremity: I82.409

## 2012-09-30 LAB — CBC
Platelets: 120 10*3/uL — ABNORMAL LOW (ref 150–400)
RBC: 3.79 MIL/uL — ABNORMAL LOW (ref 3.87–5.11)
RDW: 20.6 % — ABNORMAL HIGH (ref 11.5–15.5)
WBC: 8.2 10*3/uL (ref 4.0–10.5)

## 2012-09-30 MED ORDER — AMOXICILLIN-POT CLAVULANATE 875-125 MG PO TABS: 1.0000 | ORAL_TABLET | Freq: Two times a day (BID) | ORAL | Status: DC

## 2012-09-30 MED ORDER — OXYCODONE-ACETAMINOPHEN 5-325 MG PO TABS
2.0000 | ORAL_TABLET | Freq: Once | ORAL | Status: AC
  Administered 2012-09-30: 2 via ORAL
  Filled 2012-09-30: qty 2

## 2012-09-30 NOTE — Telephone Encounter (Signed)
Pt informed and appt made for tomorrow @ 1:45pm. Fleeger, Maryjo [place]

## 2012-09-30 NOTE — Telephone Encounter (Signed)
Attempted to call again to schedule appt for INR check on Friday. No answer so left a voicemail. Attempted to call alt. Contact, but no answer.  Shelly Flatten, MD Family Medicine PGY-2 09/30/2012, 4:08 PM

## 2012-09-30 NOTE — MAU Note (Signed)
Pt post vaginal delivery 09/27/2012, reports fever of 103.2 last night, body aches and LLQ pain.

## 2012-09-30 NOTE — MAU Note (Signed)
Dr. Jolayne Panther notified of pt.  Order rec'd for CBC.

## 2012-09-30 NOTE — Discharge Summary (Signed)
I saw and examined patient and agree with above resident note. I reviewed history, delivery summary, labs and vitals. Mignon Bechler, MD  

## 2012-09-30 NOTE — MAU Provider Note (Signed)
History     CSN: [id number]  Arrival date & time 09/30/12  7829   First Provider Initiated Contact with Patient 09/30/12 1043      Chief Complaint  Patient presents with  . Fever  . Postpartum Complications    HPI 22 yo [id number] PPD#3 s/p vaginal delivery. Patient's hospital course was uncomplicated and she was discharged yesterday feeling fine. Patient reports sudden onset of generalized aches and pains yesterday evening and reports a fever of 103. She is not breast feeding but desires to try. Infant has been mainly formula fed thus far. Patient has not taken any analgesic for her pain. She was not able to fill her prescriptions yesterday. She has not taken her lovenox yet but plans to do so upon discharge from the hospital. Past Medical History  Diagnosis Date  . Anemia     on iron  . DVT (deep venous thrombosis)     Past Surgical History  Procedure Laterality Date  . Dilation and curettage of uterus      Family History  Problem Relation Age of Onset  . Diabetes Neg Hx   . Cancer Neg Hx   . Hypertension Neg Hx   . Hyperlipidemia Neg Hx     History  Substance Use Topics  . Smoking status: Never Smoker   . Smokeless tobacco: Never Used  . Alcohol Use: No    OB History   Grav Para Term Preterm Abortions TAB SAB Ect Mult Living   6 5 5  0 1 1 0 0 0 5      Review of Systems  All other systems reviewed and are negative.    Allergies  Review of patient's allergies indicates no known allergies.  Home Medications  No current outpatient prescriptions on file.  BP 119/64  Pulse 108  Temp(Src) 99.8 F (37.7 C) (Oral)  Resp 18  Ht 5\' 3"  (1.6 m)  Wt 238 lb 3.2 oz (108.047 kg)  BMI 42.21 kg/m2  SpO2 100%  LMP 01/04/2012  Physical Exam GENERAL: Well-developed, well-nourished female in no acute distress.  HEENT: Normocephalic, atraumatic. Sclerae anicteric.  NECK: Supple. Normal thyroid.  LUNGS: Clear to auscultation bilaterally.  HEART: Regular rate and  rhythm. BREASTS: Symmetric in size. No palpable masses or lymphadenopathy, skin changes, or nipple drainage. No engorgement ABDOMEN: Soft, nontender, nondistended. No organomegaly. Fundus firm, mildy tender to deep palpation PELVIC: Normal external female genitalia. Vagina is pink and rugated.  Normal lochia EXTREMITIES: No cyanosis, clubbing, or edema, 2+ distal pulses.  MAU Course  Procedures (including critical care time)  Labs Reviewed  CBC   No results found.   No diagnosis found.    MDM  22 yo [id number] PPD#3 here for evaluation of generalized malaise - Patient afebrile in MAU - Patient feels better s/p percocet - Will discharge home with outpatient treatment of endometritis (soft clinical findings)- Augmentin BID for seven days - Patient advised to return if symptoms worsen - Breast feeding education also provided

## 2012-10-01 ENCOUNTER — Ambulatory Visit (INDEPENDENT_AMBULATORY_CARE_PROVIDER_SITE_OTHER): Payer: Self-pay | Admitting: *Deleted

## 2012-10-01 DIAGNOSIS — Z86718 Personal history of other venous thrombosis and embolism: Secondary | ICD-10-CM

## 2012-10-01 LAB — POCT INR: INR: 1.5

## 2012-10-04 ENCOUNTER — Ambulatory Visit: Payer: Self-pay

## 2012-10-04 ENCOUNTER — Encounter (HOSPITAL_COMMUNITY): Payer: Self-pay

## 2012-10-20 ENCOUNTER — Encounter: Payer: Self-pay | Admitting: *Deleted

## 2013-11-23 ENCOUNTER — Encounter (HOSPITAL_COMMUNITY): Payer: Self-pay | Admitting: Emergency Medicine

## 2013-11-23 ENCOUNTER — Inpatient Hospital Stay (HOSPITAL_COMMUNITY)
Admission: EM | Admit: 2013-11-23 | Discharge: 2013-11-25 | DRG: 418 | Disposition: A | Discharge status: Home / Self Care | Payer: Medicaid Other | Attending: Surgery | Admitting: Surgery

## 2013-11-23 DIAGNOSIS — K810 Acute cholecystitis: Secondary | ICD-10-CM | POA: Diagnosis present

## 2013-11-23 DIAGNOSIS — K8010 Calculus of gallbladder with chronic cholecystitis without obstruction: Principal | ICD-10-CM | POA: Diagnosis present

## 2013-11-23 DIAGNOSIS — E669 Obesity, unspecified: Secondary | ICD-10-CM | POA: Diagnosis present

## 2013-11-23 DIAGNOSIS — K8000 Calculus of gallbladder with acute cholecystitis without obstruction: Secondary | ICD-10-CM | POA: Diagnosis present

## 2013-11-23 DIAGNOSIS — Z6841 Body Mass Index (BMI) 40.0 and over, adult: Secondary | ICD-10-CM

## 2013-11-23 DIAGNOSIS — D649 Anemia, unspecified: Secondary | ICD-10-CM | POA: Diagnosis present

## 2013-11-23 DIAGNOSIS — Z86718 Personal history of other venous thrombosis and embolism: Secondary | ICD-10-CM

## 2013-11-23 HISTORY — DX: Family history of other specified conditions: Z84.89

## 2013-11-23 HISTORY — DX: Calculus of gallbladder without cholecystitis without obstruction: K80.20

## 2013-11-23 NOTE — ED Notes (Signed)
Pt. reports intermittent  upper abdominal pain with nausea and diarrhea onset Sunday , denies emesis , no urinary discomfort .

## 2013-11-24 ENCOUNTER — Emergency Department (HOSPITAL_COMMUNITY): Payer: Medicaid Other

## 2013-11-24 ENCOUNTER — Inpatient Hospital Stay (HOSPITAL_COMMUNITY): Payer: Medicaid Other

## 2013-11-24 ENCOUNTER — Encounter (HOSPITAL_COMMUNITY): Payer: Self-pay | Admitting: Surgery

## 2013-11-24 ENCOUNTER — Inpatient Hospital Stay (HOSPITAL_COMMUNITY): Payer: Medicaid Other | Admitting: Anesthesiology

## 2013-11-24 ENCOUNTER — Encounter (HOSPITAL_COMMUNITY): Admission: EM | Disposition: A | Discharge status: Home / Self Care | Payer: Self-pay | Source: Home / Self Care

## 2013-11-24 ENCOUNTER — Encounter (HOSPITAL_COMMUNITY): Payer: Medicaid Other | Admitting: Anesthesiology

## 2013-11-24 DIAGNOSIS — D649 Anemia, unspecified: Secondary | ICD-10-CM | POA: Diagnosis present

## 2013-11-24 DIAGNOSIS — Z86718 Personal history of other venous thrombosis and embolism: Secondary | ICD-10-CM | POA: Diagnosis not present

## 2013-11-24 DIAGNOSIS — R1013 Epigastric pain: Secondary | ICD-10-CM | POA: Diagnosis present

## 2013-11-24 DIAGNOSIS — Z6841 Body Mass Index (BMI) 40.0 and over, adult: Secondary | ICD-10-CM | POA: Diagnosis not present

## 2013-11-24 DIAGNOSIS — E669 Obesity, unspecified: Secondary | ICD-10-CM | POA: Diagnosis present

## 2013-11-24 DIAGNOSIS — K810 Acute cholecystitis: Secondary | ICD-10-CM | POA: Diagnosis present

## 2013-11-24 DIAGNOSIS — K8020 Calculus of gallbladder without cholecystitis without obstruction: Secondary | ICD-10-CM

## 2013-11-24 DIAGNOSIS — K8010 Calculus of gallbladder with chronic cholecystitis without obstruction: Secondary | ICD-10-CM | POA: Diagnosis present

## 2013-11-24 DIAGNOSIS — K8000 Calculus of gallbladder with acute cholecystitis without obstruction: Secondary | ICD-10-CM | POA: Diagnosis present

## 2013-11-24 HISTORY — PX: CHOLECYSTECTOMY: SHX55

## 2013-11-24 HISTORY — DX: Calculus of gallbladder without cholecystitis without obstruction: K80.20

## 2013-11-24 LAB — URINALYSIS, ROUTINE W REFLEX MICROSCOPIC
BILIRUBIN URINE: NEGATIVE
Glucose, UA: NEGATIVE mg/dL
KETONES UR: 15 mg/dL — AB
Leukocytes, UA: NEGATIVE
NITRITE: NEGATIVE
PROTEIN: NEGATIVE mg/dL
SPECIFIC GRAVITY, URINE: 1.022 (ref 1.005–1.030)
UROBILINOGEN UA: 0.2 mg/dL (ref 0.0–1.0)
pH: 5.5 (ref 5.0–8.0)

## 2013-11-24 LAB — CBC WITH DIFFERENTIAL/PLATELET
BASOS ABS: 0 10*3/uL (ref 0.0–0.1)
BASOS PCT: 0 % (ref 0–1)
EOS PCT: 1 % (ref 0–5)
Eosinophils Absolute: 0.1 10*3/uL (ref 0.0–0.7)
HEMATOCRIT: 36.3 % (ref 36.0–46.0)
Hemoglobin: 11.7 g/dL — ABNORMAL LOW (ref 12.0–15.0)
LYMPHS PCT: 28 % (ref 12–46)
Lymphs Abs: 1.4 10*3/uL (ref 0.7–4.0)
MCH: 27.1 pg (ref 26.0–34.0)
MCHC: 32.2 g/dL (ref 30.0–36.0)
MCV: 84 fL (ref 78.0–100.0)
MONO ABS: 0.4 10*3/uL (ref 0.1–1.0)
Monocytes Relative: 8 % (ref 3–12)
Neutro Abs: 3.3 10*3/uL (ref 1.7–7.7)
Neutrophils Relative %: 63 % (ref 43–77)
Platelets: 158 10*3/uL (ref 150–400)
RBC: 4.32 MIL/uL (ref 3.87–5.11)
RDW: 14.3 % (ref 11.5–15.5)
WBC: 5.1 10*3/uL (ref 4.0–10.5)

## 2013-11-24 LAB — COMPREHENSIVE METABOLIC PANEL
ALT: 14 U/L (ref 0–35)
AST: 14 U/L (ref 0–37)
Albumin: 4 g/dL (ref 3.5–5.2)
Alkaline Phosphatase: 74 U/L (ref 39–117)
Anion gap: 16 — ABNORMAL HIGH (ref 5–15)
BUN: 9 mg/dL (ref 6–23)
CALCIUM: 9.1 mg/dL (ref 8.4–10.5)
CO2: 24 meq/L (ref 19–32)
CREATININE: 0.51 mg/dL (ref 0.50–1.10)
Chloride: 98 mEq/L (ref 96–112)
Glucose, Bld: 92 mg/dL (ref 70–99)
Potassium: 3.4 mEq/L — ABNORMAL LOW (ref 3.7–5.3)
Sodium: 138 mEq/L (ref 137–147)
Total Bilirubin: <0.2 mg/dL — ABNORMAL LOW (ref 0.3–1.2)
Total Protein: 7.4 g/dL (ref 6.0–8.3)

## 2013-11-24 LAB — SURGICAL PCR SCREEN
MRSA, PCR: NEGATIVE
Staphylococcus aureus: NEGATIVE

## 2013-11-24 LAB — URINE MICROSCOPIC-ADD ON

## 2013-11-24 LAB — PREGNANCY, URINE: PREG TEST UR: NEGATIVE

## 2013-11-24 LAB — LIPASE, BLOOD: LIPASE: 30 U/L (ref 11–59)

## 2013-11-24 SURGERY — LAPAROSCOPIC CHOLECYSTECTOMY WITH INTRAOPERATIVE CHOLANGIOGRAM
Anesthesia: General | Site: Abdomen

## 2013-11-24 MED ORDER — MIDAZOLAM HCL 2 MG/2ML IJ SOLN
INTRAMUSCULAR | Status: AC
  Filled 2013-11-24: qty 2

## 2013-11-24 MED ORDER — FENTANYL CITRATE 0.05 MG/ML IJ SOLN
INTRAMUSCULAR | Status: AC
  Filled 2013-11-24: qty 5

## 2013-11-24 MED ORDER — KETOROLAC TROMETHAMINE 30 MG/ML IJ SOLN
INTRAMUSCULAR | Status: AC
  Filled 2013-11-24: qty 1

## 2013-11-24 MED ORDER — BUPIVACAINE-EPINEPHRINE 0.25% -1:200000 IJ SOLN
INTRAMUSCULAR | Status: DC | PRN
  Administered 2013-11-24: 14 mL

## 2013-11-24 MED ORDER — SODIUM CHLORIDE 0.9 % IR SOLN
Status: DC | PRN
  Administered 2013-11-24: 1000 mL

## 2013-11-24 MED ORDER — FENTANYL CITRATE 0.05 MG/ML IJ SOLN
50.0000 ug | Freq: Once | INTRAMUSCULAR | Status: AC
  Administered 2013-11-24: 50 ug via INTRAVENOUS
  Filled 2013-11-24: qty 2

## 2013-11-24 MED ORDER — FENTANYL CITRATE 0.05 MG/ML IJ SOLN
INTRAMUSCULAR | Status: DC | PRN
  Administered 2013-11-24: 50 ug via INTRAVENOUS
  Administered 2013-11-24: 100 ug via INTRAVENOUS
  Administered 2013-11-24 (×2): 50 ug via INTRAVENOUS

## 2013-11-24 MED ORDER — MIDAZOLAM HCL 5 MG/5ML IJ SOLN
INTRAMUSCULAR | Status: DC | PRN
  Administered 2013-11-24: 2 mg via INTRAVENOUS

## 2013-11-24 MED ORDER — PIPERACILLIN-TAZOBACTAM 3.375 G IVPB
3.3750 g | Freq: Three times a day (TID) | INTRAVENOUS | Status: DC
  Administered 2013-11-24 – 2013-11-25 (×4): 3.375 g via INTRAVENOUS
  Filled 2013-11-24 (×7): qty 50

## 2013-11-24 MED ORDER — 0.9 % SODIUM CHLORIDE (POUR BTL) OPTIME
TOPICAL | Status: DC | PRN
  Administered 2013-11-24: 1000 mL

## 2013-11-24 MED ORDER — ENOXAPARIN SODIUM 40 MG/0.4ML ~~LOC~~ SOLN
40.0000 mg | SUBCUTANEOUS | Status: DC
  Administered 2013-11-25: 40 mg via SUBCUTANEOUS
  Filled 2013-11-24 (×2): qty 0.4

## 2013-11-24 MED ORDER — GLYCOPYRROLATE 0.2 MG/ML IJ SOLN
INTRAMUSCULAR | Status: DC | PRN
  Administered 2013-11-24: 0.4 mg via INTRAVENOUS

## 2013-11-24 MED ORDER — HYDROMORPHONE HCL PF 1 MG/ML IJ SOLN
0.2500 mg | INTRAMUSCULAR | Status: DC | PRN
Start: 2013-11-24 — End: 2013-11-24
  Administered 2013-11-24 (×2): 0.5 mg via INTRAVENOUS

## 2013-11-24 MED ORDER — ONDANSETRON HCL 4 MG/2ML IJ SOLN
4.0000 mg | Freq: Four times a day (QID) | INTRAMUSCULAR | Status: DC | PRN
  Administered 2013-11-24 – 2013-11-25 (×3): 4 mg via INTRAVENOUS
  Filled 2013-11-24 (×3): qty 2

## 2013-11-24 MED ORDER — LIDOCAINE HCL (CARDIAC) 20 MG/ML IV SOLN
INTRAVENOUS | Status: DC | PRN
  Administered 2013-11-24: 60 mg via INTRAVENOUS

## 2013-11-24 MED ORDER — KETOROLAC TROMETHAMINE 30 MG/ML IJ SOLN
INTRAMUSCULAR | Status: DC | PRN
  Administered 2013-11-24: 30 mg via INTRAVENOUS

## 2013-11-24 MED ORDER — OXYCODONE-ACETAMINOPHEN 5-325 MG PO TABS
1.0000 | ORAL_TABLET | ORAL | Status: DC | PRN
Start: 2013-11-24 — End: 2013-11-25
  Administered 2013-11-25 (×2): 2 via ORAL
  Filled 2013-11-24 (×2): qty 2

## 2013-11-24 MED ORDER — HYDROMORPHONE HCL PF 1 MG/ML IJ SOLN
INTRAMUSCULAR | Status: AC
  Administered 2013-11-24: 1 mg
  Filled 2013-11-24: qty 1

## 2013-11-24 MED ORDER — SODIUM CHLORIDE 0.9 % IV BOLUS (SEPSIS)
1000.0000 mL | Freq: Once | INTRAVENOUS | Status: AC
  Administered 2013-11-24: 1000 mL via INTRAVENOUS

## 2013-11-24 MED ORDER — ROCURONIUM BROMIDE 100 MG/10ML IV SOLN
INTRAVENOUS | Status: DC | PRN
  Administered 2013-11-24: 30 mg via INTRAVENOUS

## 2013-11-24 MED ORDER — ONDANSETRON HCL 4 MG/2ML IJ SOLN
INTRAMUSCULAR | Status: DC | PRN
  Administered 2013-11-24: 4 mg via INTRAVENOUS

## 2013-11-24 MED ORDER — HYDROMORPHONE HCL PF 1 MG/ML IJ SOLN
1.0000 mg | INTRAMUSCULAR | Status: DC | PRN
  Administered 2013-11-24 – 2013-11-25 (×6): 1 mg via INTRAVENOUS
  Filled 2013-11-24 (×6): qty 1

## 2013-11-24 MED ORDER — ONDANSETRON HCL 4 MG/2ML IJ SOLN
4.0000 mg | Freq: Once | INTRAMUSCULAR | Status: AC
  Administered 2013-11-24: 4 mg via INTRAVENOUS
  Filled 2013-11-24: qty 2

## 2013-11-24 MED ORDER — BUPIVACAINE-EPINEPHRINE (PF) 0.25% -1:200000 IJ SOLN
INTRAMUSCULAR | Status: AC
  Filled 2013-11-24: qty 30

## 2013-11-24 MED ORDER — PROPOFOL 10 MG/ML IV BOLUS
INTRAVENOUS | Status: AC
  Filled 2013-11-24: qty 20

## 2013-11-24 MED ORDER — PROPOFOL 10 MG/ML IV BOLUS
INTRAVENOUS | Status: DC | PRN
  Administered 2013-11-24: 30 mg via INTRAVENOUS
  Administered 2013-11-24: 170 mg via INTRAVENOUS
  Administered 2013-11-24: 30 mg via INTRAVENOUS

## 2013-11-24 MED ORDER — SODIUM CHLORIDE 0.9 % IV SOLN
INTRAVENOUS | Status: DC
  Administered 2013-11-24 (×2): via INTRAVENOUS

## 2013-11-24 MED ORDER — LACTATED RINGERS IV SOLN
INTRAVENOUS | Status: DC
  Administered 2013-11-24 (×2): via INTRAVENOUS

## 2013-11-24 MED ORDER — NEOSTIGMINE METHYLSULFATE 10 MG/10ML IV SOLN
INTRAVENOUS | Status: DC | PRN
  Administered 2013-11-24: 3 mg via INTRAVENOUS

## 2013-11-24 MED ORDER — SODIUM CHLORIDE 0.9 % IV SOLN
INTRAVENOUS | Status: DC | PRN
  Administered 2013-11-24: 15:00:00

## 2013-11-24 SURGICAL SUPPLY — 44 items
APPLIER CLIP ROT 10 11.4 M/L (STAPLE) ×3
BENZOIN TINCTURE PRP APPL 2/3 (GAUZE/BANDAGES/DRESSINGS) ×3 IMPLANT
BLADE SURG ROTATE 9660 (MISCELLANEOUS) IMPLANT
CANISTER SUCTION 2500CC (MISCELLANEOUS) ×3 IMPLANT
CHLORAPREP W/TINT 26ML (MISCELLANEOUS) ×3 IMPLANT
CLIP APPLIE ROT 10 11.4 M/L (STAPLE) ×1 IMPLANT
CONT SPEC 4OZ CLIKSEAL STRL BL (MISCELLANEOUS) ×3 IMPLANT
COVER MAYO STAND STRL (DRAPES) ×3 IMPLANT
COVER SURGICAL LIGHT HANDLE (MISCELLANEOUS) ×3 IMPLANT
DRAPE C-ARM 42X72 X-RAY (DRAPES) ×3 IMPLANT
DRAPE UTILITY 15X26 W/TAPE STR (DRAPE) ×6 IMPLANT
DRSG TEGADERM 2-3/8X2-3/4 SM (GAUZE/BANDAGES/DRESSINGS) ×9 IMPLANT
DRSG TEGADERM 4X4.75 (GAUZE/BANDAGES/DRESSINGS) ×3 IMPLANT
ELECT REM PT RETURN 9FT ADLT (ELECTROSURGICAL) ×3
ELECTRODE REM PT RTRN 9FT ADLT (ELECTROSURGICAL) ×1 IMPLANT
FILTER SMOKE EVAC LAPAROSHD (FILTER) ×3 IMPLANT
GAUZE SPONGE 2X2 8PLY STRL LF (GAUZE/BANDAGES/DRESSINGS) ×1 IMPLANT
GLOVE BIO SURGEON STRL SZ7 (GLOVE) ×3 IMPLANT
GLOVE BIO SURGEON STRL SZ7.5 (GLOVE) ×3 IMPLANT
GLOVE BIOGEL PI IND STRL 7.0 (GLOVE) ×1 IMPLANT
GLOVE BIOGEL PI IND STRL 7.5 (GLOVE) ×2 IMPLANT
GLOVE BIOGEL PI INDICATOR 7.0 (GLOVE) ×2
GLOVE BIOGEL PI INDICATOR 7.5 (GLOVE) ×4
GLOVE SURG SS PI 7.0 STRL IVOR (GLOVE) ×3 IMPLANT
GOWN STRL REUS W/ TWL LRG LVL3 (GOWN DISPOSABLE) ×3 IMPLANT
GOWN STRL REUS W/TWL LRG LVL3 (GOWN DISPOSABLE) ×6
KIT BASIN OR (CUSTOM PROCEDURE TRAY) ×3 IMPLANT
KIT ROOM TURNOVER OR (KITS) ×3 IMPLANT
NS IRRIG 1000ML POUR BTL (IV SOLUTION) ×3 IMPLANT
PAD ARMBOARD 7.5X6 YLW CONV (MISCELLANEOUS) ×3 IMPLANT
POUCH SPECIMEN RETRIEVAL 10MM (ENDOMECHANICALS) ×3 IMPLANT
SCISSORS LAP 5X35 DISP (ENDOMECHANICALS) ×3 IMPLANT
SET CHOLANGIOGRAPH 5 50 .035 (SET/KITS/TRAYS/PACK) ×3 IMPLANT
SET IRRIG TUBING LAPAROSCOPIC (IRRIGATION / IRRIGATOR) ×3 IMPLANT
SLEEVE ENDOPATH XCEL 5M (ENDOMECHANICALS) ×3 IMPLANT
SPECIMEN JAR SMALL (MISCELLANEOUS) IMPLANT
SPONGE GAUZE 2X2 STER 10/PKG (GAUZE/BANDAGES/DRESSINGS) ×2
SUT MNCRL AB 4-0 PS2 18 (SUTURE) ×3 IMPLANT
TOWEL OR 17X24 6PK STRL BLUE (TOWEL DISPOSABLE) IMPLANT
TOWEL OR 17X26 10 PK STRL BLUE (TOWEL DISPOSABLE) ×3 IMPLANT
TRAY LAPAROSCOPIC (CUSTOM PROCEDURE TRAY) ×3 IMPLANT
TROCAR XCEL BLUNT TIP 100MML (ENDOMECHANICALS) ×3 IMPLANT
TROCAR XCEL NON-BLD 11X100MML (ENDOMECHANICALS) ×3 IMPLANT
TROCAR XCEL NON-BLD 5MMX100MML (ENDOMECHANICALS) ×3 IMPLANT

## 2013-11-24 NOTE — H&P (Signed)
Lindsey Paul is an 23 y.o. female.   Chief Complaint: epigastric abdominal pain HPI: 1 day hx of epigastric abdominal pain after eating.  Constant  Sharp no radiation severe.   Past Medical History  Diagnosis Date  . Anemia     on iron  . DVT (deep venous thrombosis)     Past Surgical History  Procedure Laterality Date  . Dilation and curettage of uterus      Family History  Problem Relation Age of Onset  . Diabetes Neg Hx   . Cancer Neg Hx   . Hypertension Neg Hx   . Hyperlipidemia Neg Hx    Social History:  reports that she has never smoked. She has never used smokeless tobacco. She reports that she does not drink alcohol or use illicit drugs.  Allergies: No Known Allergies   (Not in a hospital admission)  Results for orders placed during the hospital encounter of 11/23/13 (from the past 48 hour(s))  PREGNANCY, URINE     Status: None   Collection Time    11/23/13 11:41 PM      Result Value Ref Range   Preg Test, Ur NEGATIVE  NEGATIVE   Comment:            THE SENSITIVITY OF THIS     METHODOLOGY IS >20 mIU/mL.  URINALYSIS, ROUTINE W REFLEX MICROSCOPIC     Status: Abnormal   Collection Time    11/23/13 11:41 PM      Result Value Ref Range   Color, Urine YELLOW  YELLOW   APPearance CLEAR  CLEAR   Specific Gravity, Urine 1.022  1.005 - 1.030   pH 5.5  5.0 - 8.0   Glucose, UA NEGATIVE  NEGATIVE mg/dL   Hgb urine dipstick MODERATE (*) NEGATIVE   Bilirubin Urine NEGATIVE  NEGATIVE   Ketones, ur 15 (*) NEGATIVE mg/dL   Protein, ur NEGATIVE  NEGATIVE mg/dL   Urobilinogen, UA 0.2  0.0 - 1.0 mg/dL   Nitrite NEGATIVE  NEGATIVE   Leukocytes, UA NEGATIVE  NEGATIVE  URINE MICROSCOPIC-ADD ON     Status: None   Collection Time    11/23/13 11:41 PM      Result Value Ref Range   Squamous Epithelial / LPF RARE  RARE   RBC / HPF 3-6  <3 RBC/hpf   Bacteria, UA RARE  RARE  CBC WITH DIFFERENTIAL     Status: Abnormal   Collection Time    11/23/13 11:50 PM      Result  Value Ref Range   WBC 5.1  4.0 - 10.5 K/uL   RBC 4.32  3.87 - 5.11 MIL/uL   Hemoglobin 11.7 (*) 12.0 - 15.0 g/dL   HCT 36.3  36.0 - 46.0 %   MCV 84.0  78.0 - 100.0 fL   MCH 27.1  26.0 - 34.0 pg   MCHC 32.2  30.0 - 36.0 g/dL   RDW 14.3  11.5 - 15.5 %   Platelets 158  150 - 400 K/uL   Neutrophils Relative % 63  43 - 77 %   Neutro Abs 3.3  1.7 - 7.7 K/uL   Lymphocytes Relative 28  12 - 46 %   Lymphs Abs 1.4  0.7 - 4.0 K/uL   Monocytes Relative 8  3 - 12 %   Monocytes Absolute 0.4  0.1 - 1.0 K/uL   Eosinophils Relative 1  0 - 5 %   Eosinophils Absolute 0.1  0.0 - 0.7 K/uL  Basophils Relative 0  0 - 1 %   Basophils Absolute 0.0  0.0 - 0.1 K/uL  COMPREHENSIVE METABOLIC PANEL     Status: Abnormal   Collection Time    11/23/13 11:50 PM      Result Value Ref Range   Sodium 138  137 - 147 mEq/L   Potassium 3.4 (*) 3.7 - 5.3 mEq/L   Chloride 98  96 - 112 mEq/L   CO2 24  19 - 32 mEq/L   Glucose, Bld 92  70 - 99 mg/dL   BUN 9  6 - 23 mg/dL   Creatinine, Ser 0.51  0.50 - 1.10 mg/dL   Calcium 9.1  8.4 - 10.5 mg/dL   Total Protein 7.4  6.0 - 8.3 g/dL   Albumin 4.0  3.5 - 5.2 g/dL   AST 14  0 - 37 U/L   ALT 14  0 - 35 U/L   Alkaline Phosphatase 74  39 - 117 U/L   Total Bilirubin <0.2 (*) 0.3 - 1.2 mg/dL   GFR calc non Af Amer >90  >90 mL/min   GFR calc Af Amer >90  >90 mL/min   Comment: (NOTE)     The eGFR has been calculated using the CKD EPI equation.     This calculation has not been validated in all clinical situations.     eGFR's persistently <90 mL/min signify possible Chronic Kidney     Disease.   Anion gap 16 (*) 5 - 15  LIPASE, BLOOD     Status: None   Collection Time    11/23/13 11:50 PM      Result Value Ref Range   Lipase 30  11 - 59 U/L   US Abdomen Limited  11/24/2013   CLINICAL DATA:  ABDOMINAL PAIN  EXAM: US ABDOMEN LIMITED - RIGHT UPPER QUADRANT  COMPARISON:  None.  FINDINGS: Gallbladder:  Multiple echogenic stones seen within the gallbladder lumen, the largest of  which measured 1 cm. No gallbladder wall thickening. No free pericholecystic fluid. No sonographic Murphy sign.  Common bile duct:  Diameter: 3.6 mm  Liver:  No focal lesion identified. Within normal limits in parenchymal echogenicity.  IMPRESSION: Cholelithiasis without sonographic evidence of acute cholecystitis or biliary ductal dilatation.   Electronically Signed   By: Jeannine Boga M.D.   On: 11/24/2013 03:24    Review of Systems  Constitutional: Positive for fever.  HENT: Negative.   Eyes: Negative.   Respiratory: Negative.   Cardiovascular: Negative.   Gastrointestinal: Positive for nausea, vomiting and abdominal pain.  Genitourinary: Negative.   Musculoskeletal: Negative.   Skin: Negative.   Neurological: Negative.   Endo/Heme/Allergies: Negative.   Psychiatric/Behavioral: Negative.     Blood pressure 128/88, pulse 79, temperature 100.5 F (38.1 C), temperature source Oral, resp. rate 15, height 5' 3"  (1.6 m), weight 230 lb (104.327 kg), SpO2 99.00%, unknown if currently breastfeeding. Physical Exam  Constitutional: She appears well-developed and well-nourished.  HENT:  Head: Normocephalic and atraumatic.  Eyes: No scleral icterus.  Neck: Normal range of motion. Neck supple.  Cardiovascular: Normal rate and regular rhythm.   Respiratory: Effort normal and breath sounds normal.  GI: There is tenderness in the right upper quadrant and epigastric area. There is positive Murphy's sign.  Skin: Skin is warm and dry.     Assessment/Plan Acute cholecystitis Admit IVF Will need lap chole   Katsumi Wisler A. 11/24/2013, 5:11 AM

## 2013-11-24 NOTE — ED Notes (Signed)
Transporting patient to new room assignment. 

## 2013-11-24 NOTE — Progress Notes (Signed)
Patient ID: Lindsey Paul, female   DOB: 1991-04-11, 23 y.o.   MRN: [id number]  Spoke with patient to discuss our recommendation for laparoscopic cholecystectomy with intraoperative cholangiogram today.  The surgical procedure has been discussed with the patient.  Potential risks, benefits, alternative treatments, and expected outcomes have been explained.  All of the patient's questions at this time have been answered.  The likelihood of reaching the patient's treatment goal is good.  The patient understand the proposed surgical procedure and wishes to proceed.   Wilmon ArmsMatthew K. Corliss Skainssuei, MD, [facility]  General/ Trauma Surgery  11/24/2013 8:24 AM

## 2013-11-24 NOTE — Anesthesia Procedure Notes (Signed)
Procedure Name: Intubation Date/Time: 11/24/2013 3:06 PM Performed by: Arlice ColtMANESS, Rosaland Shiffman B Pre-anesthesia Checklist: Patient identified, Emergency Drugs available, Suction available, Patient being monitored and Timeout performed Patient Re-evaluated:Patient Re-evaluated prior to inductionOxygen Delivery Method: Circle system utilized Preoxygenation: Pre-oxygenation with 100% oxygen Intubation Type: IV induction Ventilation: Mask ventilation without difficulty Laryngoscope Size: Mac and 3 Grade View: Grade I Tube type: Oral Tube size: 7.5 mm Number of attempts: 1 Airway Equipment and Method: Stylet Placement Confirmation: ETT inserted through vocal cords under direct vision,  positive ETCO2 and breath sounds checked- equal and bilateral Secured at: 21 cm Tube secured with: Tape Dental Injury: Teeth and Oropharynx as per pre-operative assessment

## 2013-11-24 NOTE — ED Notes (Signed)
Attempted to call report

## 2013-11-24 NOTE — ED Provider Notes (Signed)
CSN: [id number]     Arrival date & time 11/23/13  2215 History   First MD Initiated Contact with Patient 11/24/13 0015     Chief Complaint  Patient presents with  . Abdominal Pain     (Consider location/radiation/quality/duration/timing/severity/associated sxs/prior Treatment) HPI Patient is a generally healthy 23 yo woman who presents with complaints of midline epigastric pain for approximately 72 hours. Pain has been increasingly severe and persistent. The patient has been nauseated but denies vomiting. She has several episodes of non-bloody diarrhea, watery, approx 5- 8 times per day.   She has diffuse myalgias and feels generally poor. No history of similar sx. No history of previous abdominal surgeries. No GU sx. Moving makes the pain worse. Staying still helps. Pain is nonradiating.   Pain is currently 6/10. At its worst, 10/10.   Past Medical History  Diagnosis Date  . Anemia     on iron  . DVT (deep venous thrombosis)    Past Surgical History  Procedure Laterality Date  . Dilation and curettage of uterus     Family History  Problem Relation Age of Onset  . Diabetes Neg Hx   . Cancer Neg Hx   . Hypertension Neg Hx   . Hyperlipidemia Neg Hx    History  Substance Use Topics  . Smoking status: Never Smoker   . Smokeless tobacco: Never Used  . Alcohol Use: No   OB History   Grav Para Term Preterm Abortions TAB SAB Ect Mult Living   6 5 5  0 1 1 0 0 0 5     Review of Systems  Ten point review of symptoms performed and is negative with the exception of symptoms noted above.    Allergies  Review of patient's allergies indicates no known allergies.  Home Medications   Prior to Admission medications   Not on File   BP 122/68  Pulse 97  Temp(Src) 100.5 F (38.1 C) (Oral)  Resp 18  Ht 5\' 3"  (1.6 m)  Wt 230 lb (104.327 kg)  BMI 40.75 kg/m2  SpO2 100% Physical Exam Gen: well developed and well nourished appearing Head: NCAT Eyes: PERL, EOMI Nose: no  epistaixis or rhinorrhea Mouth/throat: mucosa is moist and pink Neck: supple, no stridor Lungs: CTA B, no wheezing, rhonchi or rales CV: RRR, no murmur, extremities appear well perfused.  Abd: soft, obese, mildly tender over the midline epigastrium, nondistended Back: no ttp, no cva ttp Skin: warm and dry Ext: normal to inspection, no dependent edema Neuro: CN ii-xii grossly intact, no focal deficits Psyche; normal affect,  calm and cooperative.   ED Course  Procedures (including critical care time) Labs Review  Results for orders placed during the hospital encounter of 11/23/13 (from the past 24 hour(s))  PREGNANCY, URINE     Status: None   Collection Time    11/23/13 11:41 PM      Result Value Ref Range   Preg Test, Ur NEGATIVE  NEGATIVE  URINALYSIS, ROUTINE W REFLEX MICROSCOPIC     Status: Abnormal   Collection Time    11/23/13 11:41 PM      Result Value Ref Range   Color, Urine YELLOW  YELLOW   APPearance CLEAR  CLEAR   Specific Gravity, Urine 1.022  1.005 - 1.030   pH 5.5  5.0 - 8.0   Glucose, UA NEGATIVE  NEGATIVE mg/dL   Hgb urine dipstick MODERATE (*) NEGATIVE   Bilirubin Urine NEGATIVE  NEGATIVE   Ketones, ur 15 (*)  NEGATIVE mg/dL   Protein, ur NEGATIVE  NEGATIVE mg/dL   Urobilinogen, UA 0.2  0.0 - 1.0 mg/dL   Nitrite NEGATIVE  NEGATIVE   Leukocytes, UA NEGATIVE  NEGATIVE  URINE MICROSCOPIC-ADD ON     Status: None   Collection Time    11/23/13 11:41 PM      Result Value Ref Range   Squamous Epithelial / LPF RARE  RARE   RBC / HPF 3-6  <3 RBC/hpf   Bacteria, UA RARE  RARE  CBC WITH DIFFERENTIAL     Status: Abnormal   Collection Time    11/23/13 11:50 PM      Result Value Ref Range   WBC 5.1  4.0 - 10.5 K/uL   RBC 4.32  3.87 - 5.11 MIL/uL   Hemoglobin 11.7 (*) 12.0 - 15.0 g/dL   HCT 40.9  81.1 - 91.4 %   MCV 84.0  78.0 - 100.0 fL   MCH 27.1  26.0 - 34.0 pg   MCHC 32.2  30.0 - 36.0 g/dL   RDW 78.2  95.6 - 21.3 %   Platelets 158  150 - 400 K/uL    Neutrophils Relative % 63  43 - 77 %   Neutro Abs 3.3  1.7 - 7.7 K/uL   Lymphocytes Relative 28  12 - 46 %   Lymphs Abs 1.4  0.7 - 4.0 K/uL   Monocytes Relative 8  3 - 12 %   Monocytes Absolute 0.4  0.1 - 1.0 K/uL   Eosinophils Relative 1  0 - 5 %   Eosinophils Absolute 0.1  0.0 - 0.7 K/uL   Basophils Relative 0  0 - 1 %   Basophils Absolute 0.0  0.0 - 0.1 K/uL   US Abdomen Limited (Final result)  Result time: 11/24/13 03:24:36    Final result by Rad Results In Interface (11/24/13 03:24:36)    Narrative:   CLINICAL DATA: ABDOMINAL PAIN  EXAM: US ABDOMEN LIMITED - RIGHT UPPER QUADRANT  COMPARISON: None.  FINDINGS: Gallbladder:  Multiple echogenic stones seen within the gallbladder lumen, the largest of which measured 1 cm. No gallbladder wall thickening. No free pericholecystic fluid. No sonographic Murphy sign.  Common bile duct:  Diameter: 3.6 mm  Liver:  No focal lesion identified. Within normal limits in parenchymal echogenicity.  IMPRESSION: Cholelithiasis without sonographic evidence of acute cholecystitis or biliary ductal dilatation.   Electronically Signed By: Rise Mu M.D. On: 11/24/2013 03:24     MDM   Ddx: pancreatitis, gallbladder disease, colitis, ibd, ibs, gastroenteritis.   0430: Patient re-evaluated. GB U/S notable for stones with largest of 1.0 cm. Labs wnl. NO signs of cholecystitis on U/S but positive Murphy's sign on exam and temp of 100.13F on admission to ED. Patient still in pain after Fentanyl IV x 2. I have discussed the case with Dr. Mignon Pine and he will see and evaluate the patient in the ED.   Patient accepted for admission by Dr. Eben Burow, MD 11/28/13 [phone]

## 2013-11-24 NOTE — Transfer of Care (Signed)
Immediate Anesthesia Transfer of Care Note  Patient: Lindsey Paul  Procedure(s) Performed: Procedure(s): LAPAROSCOPIC CHOLECYSTECTOMY WITH INTRAOPERATIVE CHOLANGIOGRAM (N/A)  Patient Location: PACU  Anesthesia Type:General  Level of Consciousness: awake, alert  and oriented  Airway & Oxygen Therapy: Patient Spontanous Breathing and Patient connected to nasal cannula oxygen  Post-op Assessment: Report given to PACU RN and Post -op Vital signs reviewed and stable  Post vital signs: Reviewed and stable  Complications: No apparent anesthesia complications

## 2013-11-24 NOTE — Anesthesia Preprocedure Evaluation (Addendum)
Anesthesia Evaluation  Patient identified by MRN, date of birth, ID band Patient awake    Reviewed: Allergy & Precautions, H&P , NPO status , Patient's Chart, lab work & pertinent test results  Airway Mallampati: II TM Distance: >3 FB Neck ROM: Full    Dental  (+) Teeth Intact   Pulmonary neg pulmonary ROS,  breath sounds clear to auscultation        Cardiovascular negative cardio ROS  Rhythm:Regular Rate:Normal     Neuro/Psych    GI/Hepatic Neg liver ROS, GI history noted. CE   Endo/Other  negative endocrine ROS  Renal/GU negative Renal ROS     Musculoskeletal   Abdominal   Peds  Hematology   Anesthesia Other Findings   Reproductive/Obstetrics                          Anesthesia Physical Anesthesia Plan  ASA: II  Anesthesia Plan: General   Post-op Pain Management:    Induction: Intravenous  Airway Management Planned:   Additional Equipment:   Intra-op Plan:   Post-operative Plan: Extubation in OR  Informed Consent: I have reviewed the patients History and Physical, chart, labs and discussed the procedure including the risks, benefits and alternatives for the proposed anesthesia with the patient or authorized representative who has indicated his/her understanding and acceptance.   Dental advisory given  Plan Discussed with: Anesthesiologist, Surgeon and CRNA  Anesthesia Plan Comments:        Anesthesia Quick Evaluation

## 2013-11-24 NOTE — ED Notes (Signed)
Report called to Nancy, RN

## 2013-11-24 NOTE — Anesthesia Postprocedure Evaluation (Signed)
  Anesthesia Post-op Note  Patient: Lindsey Paul  Procedure(s) Performed: Procedure(s): LAPAROSCOPIC CHOLECYSTECTOMY WITH INTRAOPERATIVE CHOLANGIOGRAM (N/A)  Patient Location: PACU  Anesthesia Type:General  Level of Consciousness: awake  Airway and Oxygen Therapy: Patient Spontanous Breathing  Post-op Pain: mild  Post-op Assessment: Post-op Vital signs reviewed  Post-op Vital Signs: Reviewed  Last Vitals:  Filed Vitals:   11/24/13 1309  BP: 119/72  Pulse: 72  Temp: 36.8 C  Resp: 18    Complications: No apparent anesthesia complications

## 2013-11-24 NOTE — Op Note (Signed)
Laparoscopic Cholecystectomy with IOC Procedure Note  Indications: This patient presents with symptomatic gallbladder disease and will undergo laparoscopic cholecystectomy.  Pre-operative Diagnosis: Calculus of gallbladder with other cholecystitis, without mention of obstruction  Post-operative Diagnosis: Same  Surgeon: Alverto Shedd K.   Assistants: none  Anesthesia: General endotracheal anesthesia  ASA Class: 2  Procedure Details  The patient was seen again in the Holding Room. The risks, benefits, complications, treatment options, and expected outcomes were discussed with the patient. The possibilities of reaction to medication, pulmonary aspiration, perforation of viscus, bleeding, recurrent infection, finding a normal gallbladder, the need for additional procedures, failure to diagnose a condition, the possible need to convert to an open procedure, and creating a complication requiring transfusion or operation were discussed with the patient. The likelihood of improving the patient's symptoms with return to their baseline status is good.  The patient and/or family concurred with the proposed plan, giving informed consent. The site of surgery properly noted. The patient was taken to Operating Room, identified as Lindsey Paul and the procedure verified as Laparoscopic Cholecystectomy with Intraoperative Cholangiogram. A Time Out was held and the above information confirmed.  Prior to the induction of general anesthesia, antibiotic prophylaxis was administered. General endotracheal anesthesia was then administered and tolerated well. After the induction, the abdomen was prepped with Chloraprep and draped in the sterile fashion. The patient was positioned in the supine position.  Local anesthetic agent was injected into the skin near the umbilicus and an incision made. We dissected down to the abdominal fascia with blunt dissection.  The fascia was incised vertically and we entered the  peritoneal cavity bluntly.  A pursestring suture of 0-Vicryl was placed around the fascial opening.  The Hasson cannula was inserted and secured with the stay suture.  Pneumoperitoneum was then created with CO2 and tolerated well without any adverse changes in the patient's vital signs. An 11-mm port was placed in the subxiphoid position.  Two 5-mm ports were placed in the right upper quadrant. All skin incisions were infiltrated with a local anesthetic agent before making the incision and placing the trocars.   We positioned the patient in reverse Trendelenburg, tilted slightly to the patient's left.  The gallbladder was identified, the fundus grasped and retracted cephalad. Adhesions were lysed bluntly and with the electrocautery where indicated, taking care not to injure any adjacent organs or viscus. The infundibulum was grasped and retracted laterally, exposing the peritoneum overlying the triangle of Calot. This was then divided and exposed in a blunt fashion. A critical view of the cystic duct and cystic artery was obtained.  The cystic duct was clearly identified and bluntly dissected circumferentially. The cystic duct was ligated with a clip distally.   An incision was made in the cystic duct and the [facility] cholangiogram catheter introduced. The catheter was secured using a clip. A cholangiogram was then obtained which showed good visualization of the distal and proximal biliary tree with no sign of obstruction.    Contrast flowed easily into the duodenum. However, there were two small fillings defects in the mid-common bile duct that did not move and were not obstructing the flow of contrast.  We repeated the cholangiogram after repositioning the table, but the defects persisted.  The catheter was then removed.   The cystic duct was then ligated with clips and divided. The cystic artery was identified, dissected free, ligated with clips and divided as well.   The gallbladder was dissected from the  liver bed in retrograde  fashion with the electrocautery. The gallbladder was removed and placed in an Endocatch sac. The liver bed was irrigated and inspected. Hemostasis was achieved with the electrocautery. Copious irrigation was utilized and was repeatedly aspirated until clear.  The gallbladder and Endocatch sac were then removed through the umbilical port site.  The pursestring suture was used to close the umbilical fascia.    We again inspected the right upper quadrant for hemostasis.  Pneumoperitoneum was released as we removed the trocars.  4-0 Monocryl was used to close the skin.   Benzoin, steri-strips, and clean dressings were applied. The patient was then extubated and brought to the recovery room in stable condition. Instrument, sponge, and needle counts were correct at closure and at the conclusion of the case.   Findings: Cholecystitis with Cholelithiasis  Estimated Blood Loss: Minimal         Drains: none          Specimens: Gallbladder           Complications: None; patient tolerated the procedure well.         Disposition: PACU - hemodynamically stable.         Condition: stable  [name]. Corliss Skains, MD, [facility]  General/ Trauma Surgery  11/24/2013 4:10 PM

## 2013-11-25 ENCOUNTER — Encounter (HOSPITAL_COMMUNITY): Payer: Self-pay | Admitting: Surgery

## 2013-11-25 LAB — COMPREHENSIVE METABOLIC PANEL
ALBUMIN: 3 g/dL — AB (ref 3.5–5.2)
ALK PHOS: 65 U/L (ref 39–117)
ALT: 30 U/L (ref 0–35)
AST: 31 U/L (ref 0–37)
Anion gap: 12 (ref 5–15)
BUN: 5 mg/dL — ABNORMAL LOW (ref 6–23)
CO2: 25 mEq/L (ref 19–32)
Calcium: 7.8 mg/dL — ABNORMAL LOW (ref 8.4–10.5)
Chloride: 106 mEq/L (ref 96–112)
Creatinine, Ser: 0.51 mg/dL (ref 0.50–1.10)
GFR calc Af Amer: >90 mL/min (ref >90)
GFR calc non Af Amer: >90 mL/min (ref >90)
Glucose, Bld: 96 mg/dL (ref 70–99)
POTASSIUM: 3.6 meq/L — AB (ref 3.7–5.3)
SODIUM: 143 meq/L (ref 137–147)
TOTAL PROTEIN: 6 g/dL (ref 6.0–8.3)
Total Bilirubin: 0.2 mg/dL — ABNORMAL LOW (ref 0.3–1.2)

## 2013-11-25 LAB — CBC
HCT: 30.4 % — ABNORMAL LOW (ref 36.0–46.0)
HEMOGLOBIN: 9.7 g/dL — AB (ref 12.0–15.0)
MCH: 26.4 pg (ref 26.0–34.0)
MCHC: 31.9 g/dL (ref 30.0–36.0)
MCV: 82.8 fL (ref 78.0–100.0)
Platelets: 147 10*3/uL — ABNORMAL LOW (ref 150–400)
RBC: 3.67 MIL/uL — ABNORMAL LOW (ref 3.87–5.11)
RDW: 14.4 % (ref 11.5–15.5)
WBC: 5 10*3/uL (ref 4.0–10.5)

## 2013-11-25 MED ORDER — OXYCODONE HCL 5 MG PO TABS: 5.0000 mg | ORAL_TABLET | ORAL | Status: DC | PRN

## 2013-11-25 MED ORDER — IBUPROFEN 600 MG PO TABS: 600.0000 mg | ORAL_TABLET | Freq: Three times a day (TID) | ORAL | Status: DC

## 2013-11-25 MED ORDER — OXYCODONE HCL 5 MG PO TABS
5.0000 mg | ORAL_TABLET | ORAL | Status: DC | PRN
  Administered 2013-11-25: 15 mg via ORAL
  Filled 2013-11-25: qty 3

## 2013-11-25 MED ORDER — ONDANSETRON 4 MG PO TBDP: 4.0000 mg | ORAL_TABLET | Freq: Three times a day (TID) | ORAL | Status: DC | PRN

## 2013-11-25 MED ORDER — IBUPROFEN 600 MG PO TABS
600.0000 mg | ORAL_TABLET | Freq: Three times a day (TID) | ORAL | Status: DC
  Administered 2013-11-25 (×2): 600 mg via ORAL
  Filled 2013-11-25 (×2): qty 1

## 2013-11-25 MED ORDER — KETOROLAC TROMETHAMINE 15 MG/ML IJ SOLN
15.0000 mg | Freq: Once | INTRAMUSCULAR | Status: AC
  Administered 2013-11-25: 15 mg via INTRAVENOUS
  Filled 2013-11-25: qty 1

## 2013-11-25 NOTE — Discharge Instructions (Signed)

## 2013-11-25 NOTE — Progress Notes (Signed)
Encouraged ambulation Advance diet Probable discharge this afternoon.  Wilmon ArmsMatthew K. Corliss Skainssuei, MD, [facility]  General/ Trauma Surgery  11/25/2013 11:31 AM

## 2013-11-25 NOTE — Discharge Summary (Signed)
Physician Discharge Summary  Patient ID: Lindsey Paul MRN: [id number] DOB/AGE: 1990-11-17 23 y.o.  Admit date: 11/23/2013 Discharge date: 11/25/2013  Admitting Diagnosis: Acute cholecystitis   Discharge Diagnosis Patient Active Problem List   Diagnosis Date Noted  . Acute cholecystitis 11/24/2013  . History of Maternal DVT (deep vein thrombosis) 09/27/2012  . Oligohydramnios antepartum 09/27/2012  . UTI (urinary tract infection) during pregnancy 08/26/2012  . Uterine size-date discrepancy in third trimester 08/26/2012  . Supervision of other normal pregnancy 07/27/2012  . Yeast vaginitis 06/29/2012    Consultants none  Imaging: Dg Cholangiogram Operative  11/24/2013   CLINICAL DATA:  Cholecystectomy for cholelithiasis.  EXAM: INTRAOPERATIVE CHOLANGIOGRAM  TECHNIQUE: Cholangiographic images from the C-arm fluoroscopic device were submitted for interpretation post-operatively. Please see the procedural report for the amount of contrast and the fluoroscopy time utilized.  COMPARISON:  Ultrasound on 11/24/2013  FINDINGS: Intraoperative imaging shows a normal caliber opacified biliary tree with prompt drainage into the duodenum. Filling defect introduced with injection appears to represent an air bubble in the mid CBD. Visualized intrahepatic ducts are normal in appearance. No contrast extravasation identified.  IMPRESSION: Unremarkable intraoperative cholangiogram. Rounded filling defects in the CBD introduced with injection appear to represent air bubbles.   Electronically Signed   By: Irish Lack M.D.   On: 11/24/2013 16:16   US Abdomen Limited  11/24/2013   CLINICAL DATA:  ABDOMINAL PAIN  EXAM: US ABDOMEN LIMITED - RIGHT UPPER QUADRANT  COMPARISON:  None.  FINDINGS: Gallbladder:  Multiple echogenic stones seen within the gallbladder lumen, the largest of which measured 1 cm. No gallbladder wall thickening. No free pericholecystic fluid. No sonographic Murphy sign.  Common bile  duct:  Diameter: 3.6 mm  Liver:  No focal lesion identified. Within normal limits in parenchymal echogenicity.  IMPRESSION: Cholelithiasis without sonographic evidence of acute cholecystitis or biliary ductal dilatation.   Electronically Signed   By: Rise Mu M.D.   On: 11/24/2013 03:24    Procedures laparoscopic cholecystectomy with IOC---Dr. Corliss Skains 11/24/13  Hospital Course:  Lindsey Paul is a healthy female who presented to [facility] with epigastric abdominal pain.  Workup showed acute cholecystitis.  Patient was admitted and underwent procedure listed above.  Tolerated procedure well and was transferred to the floor.  Diet was advanced as tolerated.  On POD #1, the patient was voiding well, tolerating diet, ambulating well, pain well controlled, vital signs stable, incisions c/d/i and felt stable for discharge home.  Patient will follow up in our office in 3 weeks and knows to call with questions or concerns.  Physical Exam: General:  Alert, NAD, pleasant, comfortable Abd:  Soft, ND, mild tenderness, incisions C/D/I    Medication List         ibuprofen 600 MG tablet  Commonly known as:  ADVIL,MOTRIN  Take 1 tablet (600 mg total) by mouth every 8 (eight) hours.     oxyCODONE 5 MG immediate release tablet  Commonly known as:  Oxy IR/ROXICODONE  Take 1-3 tablets (5-15 mg total) by mouth every 4 (four) hours as needed for moderate pain, severe pain or breakthrough pain.             Follow-up Information   Follow up with [facility] On 12/20/2013. (arrive by [facility] for a 2:30PM follow up)    Contact information:   [address]   [place] [address] [phone]       Signed: Ashok Norris, ANP-BC [facility] [phone]  11/25/2013, 1:08 PM

## 2013-11-25 NOTE — Discharge Summary (Signed)
Wilmon ArmsMatthew K. Corliss Skainssuei, MD, [facility]  General/ Trauma Surgery  11/25/2013 1:34 PM

## 2013-11-25 NOTE — Progress Notes (Signed)
Patient ID: Lindsey Paul, female   DOB: 21-Apr-1991, 23 y.o.   MRN: [id number]  Subjective: C/o nausea and pain overnight, has gotten oob to bathroom.  Voiding, flatus.  Tolerated some clears.   Objective:  Vital signs:  Filed Vitals:   11/24/13 2015 11/24/13 2100 11/25/13 0217 11/25/13 0556  BP:  110/60 108/63 115/64  Pulse:  62 60 54  Temp:  98.2 F (36.8 C) 98.3 F (36.8 C) 97.9 F (36.6 C)  TempSrc:  Oral Oral Oral  Resp: _0 Height:      Weight:      SpO2:  97% 98% 99%    Last BM Date: 11/23/13  Intake/Output   Yesterday:  07/09 0701 - 07/10 0700 In: 4266.7 [P.O.:580; I.V.:3686.7] Out: -  This shift: I/O last 3 completed shifts: In: 5266.7 [P.O.:580; I.V.:4686.7] Out: -     Physical Exam: General: Pt awake/alert/oriented x4 in no acute distress Chest: CTA. No chest wall pain w good excursion CV:  Pulses intact.  Regular rhythm Abdomen: Soft.  Nondistended.  Appropriately tender.  Incisions are c/d/i.  No evidence of peritonitis.  No incarcerated hernias. Ext:  SCDs BLE.  No mjr edema.  No cyanosis Skin: No petechiae / purpura   Problem List:   Active Problems:   Acute cholecystitis    Results:   Labs: Results for orders placed during the hospital encounter of 11/23/13 (from the past 48 hour(s))  PREGNANCY, URINE     Status: None   Collection Time    11/23/13 11:41 PM      Result Value Ref Range   Preg Test, Ur NEGATIVE  NEGATIVE   Comment:            THE SENSITIVITY OF THIS     METHODOLOGY IS >20 mIU/mL.  URINALYSIS, ROUTINE W REFLEX MICROSCOPIC     Status: Abnormal   Collection Time    11/23/13 11:41 PM      Result Value Ref Range   Color, Urine YELLOW  YELLOW   APPearance CLEAR  CLEAR   Specific Gravity, Urine 1.022  1.005 - 1.030   pH 5.5  5.0 - 8.0   Glucose, UA NEGATIVE  NEGATIVE mg/dL   Hgb urine dipstick MODERATE (*) NEGATIVE   Bilirubin Urine NEGATIVE  NEGATIVE   Ketones, ur 15 (*) NEGATIVE mg/dL   Protein, ur  NEGATIVE  NEGATIVE mg/dL   Urobilinogen, UA 0.2  0.0 - 1.0 mg/dL   Nitrite NEGATIVE  NEGATIVE   Leukocytes, UA NEGATIVE  NEGATIVE  URINE MICROSCOPIC-ADD ON     Status: None   Collection Time    11/23/13 11:41 PM      Result Value Ref Range   Squamous Epithelial / LPF RARE  RARE   RBC / HPF 3-6  <3 RBC/hpf   Bacteria, UA RARE  RARE  CBC WITH DIFFERENTIAL     Status: Abnormal   Collection Time    11/23/13 11:50 PM      Result Value Ref Range   WBC 5.1  4.0 - 10.5 K/uL   RBC 4.32  3.87 - 5.11 MIL/uL   Hemoglobin 11.7 (*) 12.0 - 15.0 g/dL   HCT 36.3  36.0 - 46.0 %   MCV 84.0  78.0 - 100.0 fL   MCH 27.1  26.0 - 34.0 pg   MCHC 32.2  30.0 - 36.0 g/dL   RDW 14.3  11.5 - 15.5 %   Platelets 158  150 - 400 K/uL  Neutrophils Relative % 63  43 - 77 %   Neutro Abs 3.3  1.7 - 7.7 K/uL   Lymphocytes Relative 28  12 - 46 %   Lymphs Abs 1.4  0.7 - 4.0 K/uL   Monocytes Relative 8  3 - 12 %   Monocytes Absolute 0.4  0.1 - 1.0 K/uL   Eosinophils Relative 1  0 - 5 %   Eosinophils Absolute 0.1  0.0 - 0.7 K/uL   Basophils Relative 0  0 - 1 %   Basophils Absolute 0.0  0.0 - 0.1 K/uL  COMPREHENSIVE METABOLIC PANEL     Status: Abnormal   Collection Time    11/23/13 11:50 PM      Result Value Ref Range   Sodium 138  137 - 147 mEq/L   Potassium 3.4 (*) 3.7 - 5.3 mEq/L   Chloride 98  96 - 112 mEq/L   CO2 24  19 - 32 mEq/L   Glucose, Bld 92  70 - 99 mg/dL   BUN 9  6 - 23 mg/dL   Creatinine, Ser 0.51  0.50 - 1.10 mg/dL   Calcium 9.1  8.4 - 10.5 mg/dL   Total Protein 7.4  6.0 - 8.3 g/dL   Albumin 4.0  3.5 - 5.2 g/dL   AST 14  0 - 37 U/L   ALT 14  0 - 35 U/L   Alkaline Phosphatase 74  39 - 117 U/L   Total Bilirubin <0.2 (*) 0.3 - 1.2 mg/dL   GFR calc non Af Amer >90  >90 mL/min   GFR calc Af Amer >90  >90 mL/min   Comment: (NOTE)     The eGFR has been calculated using the CKD EPI equation.     This calculation has not been validated in all clinical situations.     eGFR's persistently <90  mL/min signify possible Chronic Kidney     Disease.   Anion gap 16 (*) 5 - 15  LIPASE, BLOOD     Status: None   Collection Time    11/23/13 11:50 PM      Result Value Ref Range   Lipase 30  11 - 59 U/L  SURGICAL PCR SCREEN     Status: None   Collection Time    11/24/13 10:04 AM      Result Value Ref Range   MRSA, PCR NEGATIVE  NEGATIVE   Staphylococcus aureus NEGATIVE  NEGATIVE   Comment:            The Xpert SA Assay (FDA     approved for NASAL specimens     in patients over 13 years of age),     is one component of     a comprehensive surveillance     program.  Test performance has     been validated by [employer] for patients greater     than or equal to 32 year old.     It is not intended     to diagnose infection nor to     guide or monitor treatment.  COMPREHENSIVE METABOLIC PANEL     Status: Abnormal   Collection Time    11/25/13  5:35 AM      Result Value Ref Range   Sodium 143  137 - 147 mEq/L   Potassium 3.6 (*) 3.7 - 5.3 mEq/L   Chloride 106  96 - 112 mEq/L   CO2 25  19 - 32 mEq/L  Glucose, Bld 96  70 - 99 mg/dL   BUN 5 (*) 6 - 23 mg/dL   Creatinine, Ser 0.51  0.50 - 1.10 mg/dL   Calcium 7.8 (*) 8.4 - 10.5 mg/dL   Total Protein 6.0  6.0 - 8.3 g/dL   Albumin 3.0 (*) 3.5 - 5.2 g/dL   AST 31  0 - 37 U/L   ALT 30  0 - 35 U/L   Alkaline Phosphatase 65  39 - 117 U/L   Total Bilirubin 0.2 (*) 0.3 - 1.2 mg/dL   GFR calc non Af Amer >90  >90 mL/min   GFR calc Af Amer >90  >90 mL/min   Comment: (NOTE)     The eGFR has been calculated using the CKD EPI equation.     This calculation has not been validated in all clinical situations.     eGFR's persistently <90 mL/min signify possible Chronic Kidney     Disease.   Anion gap 12  5 - 15    Imaging / Studies: Dg Cholangiogram Operative  11/24/2013   CLINICAL DATA:  Cholecystectomy for cholelithiasis.  EXAM: INTRAOPERATIVE CHOLANGIOGRAM  TECHNIQUE: Cholangiographic images from the C-arm fluoroscopic device  were submitted for interpretation post-operatively. Please see the procedural report for the amount of contrast and the fluoroscopy time utilized.  COMPARISON:  Ultrasound on 11/24/2013  FINDINGS: Intraoperative imaging shows a normal caliber opacified biliary tree with prompt drainage into the duodenum. Filling defect introduced with injection appears to represent an air bubble in the mid CBD. Visualized intrahepatic ducts are normal in appearance. No contrast extravasation identified.  IMPRESSION: Unremarkable intraoperative cholangiogram. Rounded filling defects in the CBD introduced with injection appear to represent air bubbles.   Electronically Signed   By: Aletta Edouard M.D.   On: 11/24/2013 16:16   US Abdomen Limited  11/24/2013   CLINICAL DATA:  ABDOMINAL PAIN  EXAM: US ABDOMEN LIMITED - RIGHT UPPER QUADRANT  COMPARISON:  None.  FINDINGS: Gallbladder:  Multiple echogenic stones seen within the gallbladder lumen, the largest of which measured 1 cm. No gallbladder wall thickening. No free pericholecystic fluid. No sonographic Murphy sign.  Common bile duct:  Diameter: 3.6 mm  Liver:  No focal lesion identified. Within normal limits in parenchymal echogenicity.  IMPRESSION: Cholelithiasis without sonographic evidence of acute cholecystitis or biliary ductal dilatation.   Electronically Signed   By: Jeannine Boga M.D.   On: 11/24/2013 03:24    Scheduled Meds: . enoxaparin (LOVENOX) injection  40 mg Subcutaneous Q24H  . ibuprofen  600 mg Oral 3 times per day  . ketorolac  15 mg Intravenous Once  . piperacillin-tazobactam (ZOSYN)  IV  3.375 g Intravenous 3 times per day   Continuous Infusions: . sodium chloride 100 mL/hr at 11/24/13 2217  . lactated ringers 50 mL/hr at 11/24/13 1327   PRN Meds:.HYDROmorphone (DILAUDID) injection, ondansetron, oxyCODONE-acetaminophen   Antibiotics: Anti-infectives   Start     Dose/Rate Route Frequency Ordered Stop   11/24/13 0900   piperacillin-tazobactam (ZOSYN) IVPB 3.375 g    Comments:  Pharmacy to check dosing   3.375 g 12.5 mL/hr over 240 Minutes Intravenous 3 times per day 11/24/13 0819        Assessment/Plan Cholecystitis S/p laparoscopic cholecystectomy---Dr. Georgette Dover 11/24/13 Give toradol x1, start ibuprofen scheduled, PRN percocet Mobilize Advance diet SCDs/lovenox Anticipate DC this afternoon  Erby Pian, [facility] Pager [phone] Office [phone]  11/25/2013 7:57 AM

## 2013-12-20 ENCOUNTER — Encounter (INDEPENDENT_AMBULATORY_CARE_PROVIDER_SITE_OTHER): Payer: Self-pay

## 2013-12-28 ENCOUNTER — Encounter (INDEPENDENT_AMBULATORY_CARE_PROVIDER_SITE_OTHER): Payer: Self-pay | Admitting: General Surgery

## 2014-03-20 ENCOUNTER — Encounter (HOSPITAL_COMMUNITY): Payer: Self-pay | Admitting: Surgery

## 2014-03-25 ENCOUNTER — Encounter (HOSPITAL_COMMUNITY): Payer: Self-pay | Admitting: *Deleted

## 2014-03-25 ENCOUNTER — Emergency Department (HOSPITAL_COMMUNITY)
Admission: EM | Admit: 2014-03-25 | Discharge: 2014-03-25 | Disposition: A | Discharge status: Home / Self Care | Payer: MEDICAID | Attending: Emergency Medicine | Admitting: Emergency Medicine

## 2014-03-25 DIAGNOSIS — J029 Acute pharyngitis, unspecified: Secondary | ICD-10-CM | POA: Insufficient documentation

## 2014-03-25 DIAGNOSIS — R000 Tachycardia, unspecified: Secondary | ICD-10-CM | POA: Insufficient documentation

## 2014-03-25 DIAGNOSIS — Z791 Long term (current) use of non-steroidal anti-inflammatories (NSAID): Secondary | ICD-10-CM | POA: Insufficient documentation

## 2014-03-25 DIAGNOSIS — Z862 Personal history of diseases of the blood and blood-forming organs and certain disorders involving the immune mechanism: Secondary | ICD-10-CM | POA: Insufficient documentation

## 2014-03-25 DIAGNOSIS — Z8719 Personal history of other diseases of the digestive system: Secondary | ICD-10-CM | POA: Insufficient documentation

## 2014-03-25 DIAGNOSIS — Z86718 Personal history of other venous thrombosis and embolism: Secondary | ICD-10-CM | POA: Insufficient documentation

## 2014-03-25 LAB — RAPID STREP SCREEN (MED CTR MEBANE ONLY): Streptococcus, Group A Screen (Direct): NEGATIVE

## 2014-03-25 LAB — MONONUCLEOSIS SCREEN: MONO SCREEN: NEGATIVE

## 2014-03-25 MED ORDER — PREDNISONE 20 MG PO TABS
60.0000 mg | ORAL_TABLET | Freq: Once | ORAL | Status: DC
  Filled 2014-03-25: qty 3

## 2014-03-25 MED ORDER — DEXAMETHASONE 4 MG PO TABS
10.0000 mg | ORAL_TABLET | Freq: Once | ORAL | Status: AC
  Administered 2014-03-25: 10 mg via ORAL
  Filled 2014-03-25: qty 3

## 2014-03-25 MED ORDER — PREDNISONE 20 MG PO TABS: ORAL_TABLET | ORAL | Status: DC

## 2014-03-25 MED ORDER — HYDROCODONE-HOMATROPINE 5-1.5 MG/5ML PO SYRP: 5.0000 mL | ORAL_SOLUTION | Freq: Four times a day (QID) | ORAL | Status: AC | PRN

## 2014-03-25 MED ORDER — ACETAMINOPHEN 325 MG PO TABS
650.0000 mg | ORAL_TABLET | Freq: Once | ORAL | Status: AC
  Administered 2014-03-25: 650 mg via ORAL
  Filled 2014-03-25: qty 2

## 2014-03-25 NOTE — ED Notes (Signed)
Pt reports severe sore throat for days with fever, reports unable to swallow. Airway intact at triage.

## 2014-03-25 NOTE — Discharge Instructions (Signed)

## 2014-03-25 NOTE — ED Provider Notes (Signed)
CSN: [id number]     Arrival date & time 03/25/14  [id number] History   First MD Initiated Contact with Patient 03/25/14 [phone]     Chief Complaint  Patient presents with  . Fever  . Sore Throat     (Consider location/radiation/quality/duration/timing/severity/associated sxs/prior Treatment) HPI   23 year old female presents complaining of fever and sore throat.  Patient report gradual onset of sore throat ongoing for the past 2 days. She reported increasing pain and discomfort with swallowing. She reports having fever, chills, body aches, and feeling fatigue. She tries Tylenol and ibuprofen with minimal relief. She also reported having mild to moderate frontal headache along with a sore throat. She denies having any runny nose, sneeze, cough, ear pain, chest pain, trouble breathing, abdominal pain, vomiting or diarrhea. She has had prior strep throat in the past. Recent sick contact includes viral sickness from the kids 2 weeks ago. no recent travel. Patient is a nonsmoker.  Past Medical History  Diagnosis Date  . Anemia     on iron  . DVT (deep venous thrombosis)   . Family history of anesthesia complication     sister had hard time waking "  . Cholecystolithiasis 11/24/2013   Past Surgical History  Procedure Laterality Date  . Dilation and curettage of uterus    . Cholecystectomy N/A 11/24/2013    Procedure: LAPAROSCOPIC CHOLECYSTECTOMY WITH INTRAOPERATIVE CHOLANGIOGRAM;  Surgeon: Wilmon ArmsMatthew K. Corliss Skainssuei, MD;  Location: [facility] OR;  Service: General;  Laterality: N/A;   Family History  Problem Relation Age of Onset  . Diabetes Neg Hx   . Cancer Neg Hx   . Hypertension Neg Hx   . Hyperlipidemia Neg Hx    History  Substance Use Topics  . Smoking status: Never Smoker   . Smokeless tobacco: Never Used  . Alcohol Use: No   OB History    Gravida Para Term Preterm AB TAB SAB Ectopic Multiple Living   6 5 5  0 1 1 0 0 0 5     Review of Systems  Constitutional: Positive for fever and chills.   HENT: Positive for sore throat. Negative for voice change.   Gastrointestinal: Negative for abdominal pain.  Skin: Negative for rash.      Allergies  Review of patient's allergies indicates no known allergies.  Home Medications   Prior to Admission medications   Medication Sig Start Date End Date Taking? Authorizing Provider  ibuprofen (ADVIL,MOTRIN) 600 MG tablet Take 1 tablet (600 mg total) by mouth every 8 (eight) hours. 11/25/13   Emina Riebock, NP  ondansetron (ZOFRAN ODT) 4 MG disintegrating tablet Take 1 tablet (4 mg total) by mouth every 8 (eight) hours as needed for nausea or vomiting. 11/25/13   Manus RuddMatthew Tsuei, MD  oxyCODONE (OXY IR/ROXICODONE) 5 MG immediate release tablet Take 1-3 tablets (5-15 mg total) by mouth every 4 (four) hours as needed for moderate pain, severe pain or breakthrough pain. 11/25/13   Emina Riebock, NP   BP 128/68 mmHg  Pulse 116  Temp(Src) 99.7 F (37.6 C) (Oral)  Resp 22  Ht 5\' 3"  (1.6 m)  Wt 237 lb (107.502 kg)  BMI 41.99 kg/m2  SpO2 100% Physical Exam  Constitutional: She appears well-developed and well-nourished. No distress.  HENT:  Head: Atraumatic.  Ears: Normal TMs bilaterally Nose: Normal nares Throat: Uvula is midline, bilateral tonsillar enlargement and exudates, no trismus. No obvious peritonsillar abscess or retropharyngeal abscess.  Eyes: Conjunctivae are normal.  Neck: Normal range of motion. Neck supple.  No nuchal rigidity  Cardiovascular: Exam reveals no gallop and no friction rub.   No murmur heard. Tachycardia without murmurs rubs or gallops  Pulmonary/Chest: Effort normal and breath sounds normal.  Abdominal: Soft.  No hepatosplenomegaly  Lymphadenopathy:    She has cervical adenopathy.  Neurological: She is alert.  Skin: No rash noted.  Psychiatric: She has a normal mood and affect.  Nursing note and vitals reviewed.   ED Course  Procedures (including critical care time)  9:27 AM Patient here with sore  throat. She meet [facility] criteria for strep rule out.  Will check monospot as well.  Pt is tachycardic, likely 2/2 fever.  Tylenol given, PO fluid encourage.  Will monitor.    10:47 AM Heart rate improves, Pt tolerates PO.  Steroid given.  Will d/c with sxs control.  Cultures sent.  Return precaution discussed.    Labs Review Labs Reviewed  RAPID STREP SCREEN  CULTURE, GROUP A STREP  MONONUCLEOSIS SCREEN    Imaging Review No results found.   EKG Interpretation None      MDM   Final diagnoses:  Viral pharyngitis    BP 115/71 mmHg  Pulse 100  Temp(Src) 99.6 F (37.6 C) (Oral)  Resp 14  Ht 5\' 3"  (1.6 m)  Wt 237 lb (107.502 kg)  BMI 41.99 kg/m2  SpO2 99%     Fayrene HelperBowie Tarvaris Puglia, PA-C 03/25/14 1051  Mirian MoMatthew Gentry, MD 03/26/14 [phone]

## 2014-03-27 LAB — CULTURE, GROUP A STREP
